# Patient Record
Sex: Male | Born: 1965 | Race: White | Hispanic: No | Marital: Married | Smoking: Former smoker
Health system: Southern US, Community
[De-identification: ages and names within clinical notes are randomized; demographics above are authoritative.]

## PROBLEM LIST (undated history)

## (undated) DIAGNOSIS — F319 Bipolar disorder, unspecified: Secondary | ICD-10-CM

## (undated) DIAGNOSIS — R7303 Prediabetes: Secondary | ICD-10-CM

## (undated) DIAGNOSIS — C4490 Unspecified malignant neoplasm of skin, unspecified: Secondary | ICD-10-CM

## (undated) DIAGNOSIS — K219 Gastro-esophageal reflux disease without esophagitis: Secondary | ICD-10-CM

## (undated) DIAGNOSIS — F419 Anxiety disorder, unspecified: Secondary | ICD-10-CM

## (undated) DIAGNOSIS — M545 Low back pain, unspecified: Secondary | ICD-10-CM

## (undated) DIAGNOSIS — K859 Acute pancreatitis without necrosis or infection, unspecified: Secondary | ICD-10-CM

## (undated) DIAGNOSIS — M1990 Unspecified osteoarthritis, unspecified site: Secondary | ICD-10-CM

## (undated) DIAGNOSIS — G8929 Other chronic pain: Secondary | ICD-10-CM

## (undated) HISTORY — PX: SKIN CANCER EXCISION: SHX779

## (undated) HISTORY — PX: TOTAL HIP ARTHROPLASTY: SHX124

## (undated) HISTORY — PX: COLON SURGERY: SHX602

---

## 2005-07-22 ENCOUNTER — Emergency Department (HOSPITAL_COMMUNITY): Admission: EM | Admit: 2005-07-22 | Discharge: 2005-07-22 | Payer: Self-pay | Admitting: Emergency Medicine

## 2005-08-31 ENCOUNTER — Emergency Department (HOSPITAL_COMMUNITY): Admission: EM | Admit: 2005-08-31 | Discharge: 2005-08-31 | Payer: Self-pay | Admitting: Emergency Medicine

## 2005-12-21 HISTORY — PX: JOINT REPLACEMENT: SHX530

## 2006-01-06 ENCOUNTER — Ambulatory Visit: Payer: Self-pay | Admitting: Unknown Physician Specialty

## 2006-04-04 ENCOUNTER — Emergency Department: Payer: Self-pay | Admitting: Emergency Medicine

## 2006-04-14 ENCOUNTER — Ambulatory Visit: Payer: Self-pay | Admitting: Family Medicine

## 2006-04-19 ENCOUNTER — Ambulatory Visit (HOSPITAL_COMMUNITY): Admission: RE | Admit: 2006-04-19 | Discharge: 2006-04-19 | Payer: Self-pay | Admitting: Orthopedic Surgery

## 2006-05-26 ENCOUNTER — Encounter: Payer: Self-pay | Admitting: Orthopedic Surgery

## 2006-07-15 ENCOUNTER — Encounter: Payer: Self-pay | Admitting: Orthopedic Surgery

## 2006-07-21 ENCOUNTER — Encounter: Payer: Self-pay | Admitting: Orthopedic Surgery

## 2006-12-15 ENCOUNTER — Inpatient Hospital Stay (HOSPITAL_COMMUNITY): Admission: RE | Admit: 2006-12-15 | Discharge: 2006-12-18 | Payer: Self-pay | Admitting: Orthopedic Surgery

## 2007-01-17 ENCOUNTER — Encounter: Payer: Self-pay | Admitting: Orthopedic Surgery

## 2007-01-21 ENCOUNTER — Encounter: Payer: Self-pay | Admitting: Orthopedic Surgery

## 2007-02-19 ENCOUNTER — Encounter: Payer: Self-pay | Admitting: Orthopedic Surgery

## 2007-06-13 ENCOUNTER — Emergency Department: Payer: Self-pay | Admitting: Emergency Medicine

## 2008-07-27 ENCOUNTER — Other Ambulatory Visit: Payer: Self-pay

## 2008-07-27 ENCOUNTER — Emergency Department: Payer: Self-pay | Admitting: Emergency Medicine

## 2014-02-16 ENCOUNTER — Emergency Department: Payer: Self-pay | Admitting: Emergency Medicine

## 2014-02-16 LAB — COMPREHENSIVE METABOLIC PANEL
ALT: 81 U/L — AB (ref 12–78)
Albumin: 4.3 g/dL (ref 3.4–5.0)
Alkaline Phosphatase: 64 U/L
Anion Gap: 11 (ref 7–16)
BUN: 18 mg/dL (ref 7–18)
Bilirubin,Total: 0.5 mg/dL (ref 0.2–1.0)
CHLORIDE: 101 mmol/L (ref 98–107)
CO2: 24 mmol/L (ref 21–32)
Calcium, Total: 10.3 mg/dL — ABNORMAL HIGH (ref 8.5–10.1)
Creatinine: 1.08 mg/dL (ref 0.60–1.30)
EGFR (Non-African Amer.): >60
GLUCOSE: 168 mg/dL — AB (ref 65–99)
Osmolality: 278 (ref 275–301)
Potassium: 4.7 mmol/L (ref 3.5–5.1)
SGOT(AST): 30 U/L (ref 15–37)
SODIUM: 136 mmol/L (ref 136–145)
Total Protein: 8.5 g/dL — ABNORMAL HIGH (ref 6.4–8.2)

## 2014-02-16 LAB — CBC
HCT: 52.5 % — AB (ref 40.0–52.0)
HGB: 17.7 g/dL (ref 13.0–18.0)
MCH: 31.8 pg (ref 26.0–34.0)
MCHC: 33.8 g/dL (ref 32.0–36.0)
MCV: 94 fL (ref 80–100)
Platelet: 199 10*3/uL (ref 150–440)
RBC: 5.57 10*6/uL (ref 4.40–5.90)
RDW: 14.2 % (ref 11.5–14.5)
WBC: 14.6 10*3/uL — ABNORMAL HIGH (ref 3.8–10.6)

## 2014-02-16 LAB — LIPASE, BLOOD: Lipase: 403 U/L — ABNORMAL HIGH (ref 73–393)

## 2015-04-02 DIAGNOSIS — C4491 Basal cell carcinoma of skin, unspecified: Secondary | ICD-10-CM

## 2015-04-02 HISTORY — DX: Basal cell carcinoma of skin, unspecified: C44.91

## 2015-06-28 ENCOUNTER — Other Ambulatory Visit: Payer: Self-pay | Admitting: Internal Medicine

## 2015-06-28 DIAGNOSIS — G8929 Other chronic pain: Secondary | ICD-10-CM

## 2015-06-28 DIAGNOSIS — M25512 Pain in left shoulder: Principal | ICD-10-CM

## 2015-07-04 ENCOUNTER — Ambulatory Visit: Payer: Self-pay

## 2015-07-26 ENCOUNTER — Ambulatory Visit: Admission: RE | Admit: 2015-07-26 | Payer: Self-pay | Source: Ambulatory Visit

## 2015-11-28 ENCOUNTER — Emergency Department (HOSPITAL_COMMUNITY): Payer: Worker's Compensation

## 2015-11-28 ENCOUNTER — Encounter (HOSPITAL_COMMUNITY): Payer: Self-pay

## 2015-11-28 ENCOUNTER — Emergency Department (HOSPITAL_COMMUNITY)
Admission: EM | Admit: 2015-11-28 | Discharge: 2015-11-28 | Disposition: A | Discharge status: Home / Self Care | Payer: Worker's Compensation | Attending: Emergency Medicine | Admitting: Emergency Medicine

## 2015-11-28 DIAGNOSIS — S3992XA Unspecified injury of lower back, initial encounter: Secondary | ICD-10-CM | POA: Insufficient documentation

## 2015-11-28 DIAGNOSIS — Y9389 Activity, other specified: Secondary | ICD-10-CM | POA: Diagnosis not present

## 2015-11-28 DIAGNOSIS — F17200 Nicotine dependence, unspecified, uncomplicated: Secondary | ICD-10-CM | POA: Diagnosis not present

## 2015-11-28 DIAGNOSIS — S199XXA Unspecified injury of neck, initial encounter: Secondary | ICD-10-CM | POA: Diagnosis present

## 2015-11-28 DIAGNOSIS — Y92410 Unspecified street and highway as the place of occurrence of the external cause: Secondary | ICD-10-CM | POA: Diagnosis not present

## 2015-11-28 DIAGNOSIS — S299XXA Unspecified injury of thorax, initial encounter: Secondary | ICD-10-CM | POA: Insufficient documentation

## 2015-11-28 DIAGNOSIS — S161XXA Strain of muscle, fascia and tendon at neck level, initial encounter: Secondary | ICD-10-CM | POA: Diagnosis not present

## 2015-11-28 DIAGNOSIS — Y998 Other external cause status: Secondary | ICD-10-CM | POA: Diagnosis not present

## 2015-11-28 DIAGNOSIS — F319 Bipolar disorder, unspecified: Secondary | ICD-10-CM | POA: Insufficient documentation

## 2015-11-28 HISTORY — DX: Bipolar disorder, unspecified: F31.9

## 2015-11-28 LAB — URINALYSIS, ROUTINE W REFLEX MICROSCOPIC
BILIRUBIN URINE: NEGATIVE
Glucose, UA: 100 mg/dL — AB
HGB URINE DIPSTICK: NEGATIVE
Ketones, ur: NEGATIVE mg/dL
Leukocytes, UA: NEGATIVE
Nitrite: NEGATIVE
PH: 7 (ref 5.0–8.0)
Protein, ur: NEGATIVE mg/dL
SPECIFIC GRAVITY, URINE: 1.02 (ref 1.005–1.030)

## 2015-11-28 MED ORDER — HYDROCODONE-ACETAMINOPHEN 5-325 MG PO TABS
2.0000 | ORAL_TABLET | Freq: Once | ORAL | Status: AC
  Administered 2015-11-28: 2 via ORAL
  Filled 2015-11-28: qty 2

## 2015-11-28 MED ORDER — IBUPROFEN 800 MG PO TABS: 800.0000 mg | ORAL_TABLET | Freq: Three times a day (TID) | ORAL | Status: DC

## 2015-11-28 MED ORDER — HYDROCODONE-ACETAMINOPHEN 5-325 MG PO TABS: 1.0000 | ORAL_TABLET | ORAL | Status: DC | PRN
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
## 2015-11-28 NOTE — ED Notes (Signed)
Pt ambulated in hallway with NT. Pt's gait is steady and even. Pt reports tenderness to neck and lower back. Denies dizziness.
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
## 2015-11-28 NOTE — ED Notes (Signed)
Pt tolerated ambulation well.

## 2015-11-28 NOTE — ED Notes (Signed)
Pt refusing CT scan and lab work. Pt reports that the only reason why his stomach hurt, was due to the need to urinate. MD Rancour notified. Pt ambulated to bathroom to obtain urine specimen.

## 2015-11-28 NOTE — ED Notes (Signed)
Pt's boss arrived requesting drug screen for employees involved in [place]. Informed Lannette Donath that drug screen would be performed in the lab after pt's d/c. Pt given vicodin for pain in ED.
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
## 2015-11-28 NOTE — ED Notes (Signed)
Pt requesting to be d/c. MD Rancour notified.
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
## 2015-11-28 NOTE — ED Notes (Signed)
Pt given water to drink. Pt tolerating well at this time. 

## 2016-04-01 ENCOUNTER — Other Ambulatory Visit: Payer: Self-pay | Admitting: Orthopedic Surgery

## 2016-04-01 DIAGNOSIS — M533 Sacrococcygeal disorders, not elsewhere classified: Secondary | ICD-10-CM

## 2016-04-07 ENCOUNTER — Ambulatory Visit
Admission: RE | Admit: 2016-04-07 | Discharge: 2016-04-07 | Disposition: A | Discharge status: Home / Self Care | Payer: Worker's Compensation | Source: Ambulatory Visit | Attending: Orthopedic Surgery | Admitting: Orthopedic Surgery

## 2016-04-07 DIAGNOSIS — M533 Sacrococcygeal disorders, not elsewhere classified: Secondary | ICD-10-CM

## 2016-05-04 ENCOUNTER — Encounter: Payer: Self-pay | Admitting: *Deleted

## 2016-05-04 ENCOUNTER — Emergency Department: Payer: Self-pay

## 2016-05-04 ENCOUNTER — Emergency Department
Admission: EM | Admit: 2016-05-04 | Discharge: 2016-05-04 | Disposition: A | Discharge status: Home / Self Care | Payer: Self-pay | Attending: Emergency Medicine | Admitting: Emergency Medicine

## 2016-05-04 DIAGNOSIS — F319 Bipolar disorder, unspecified: Secondary | ICD-10-CM | POA: Insufficient documentation

## 2016-05-04 DIAGNOSIS — F17200 Nicotine dependence, unspecified, uncomplicated: Secondary | ICD-10-CM | POA: Insufficient documentation

## 2016-05-04 DIAGNOSIS — K529 Noninfective gastroenteritis and colitis, unspecified: Secondary | ICD-10-CM | POA: Insufficient documentation

## 2016-05-04 LAB — BASIC METABOLIC PANEL
ANION GAP: 8 (ref 5–15)
BUN: 13 mg/dL (ref 6–20)
CO2: 27 mmol/L (ref 22–32)
Calcium: 9.9 mg/dL (ref 8.9–10.3)
Chloride: 104 mmol/L (ref 101–111)
Creatinine, Ser: 1 mg/dL (ref 0.61–1.24)
GFR calc Af Amer: >60 mL/min (ref >60)
GLUCOSE: 180 mg/dL — AB (ref 65–99)
POTASSIUM: 4.8 mmol/L (ref 3.5–5.1)
Sodium: 139 mmol/L (ref 135–145)

## 2016-05-04 LAB — HEPATIC FUNCTION PANEL
ALBUMIN: 4.1 g/dL (ref 3.5–5.0)
ALT: 69 U/L — AB (ref 17–63)
AST: 38 U/L (ref 15–41)
Alkaline Phosphatase: 56 U/L (ref 38–126)
Total Bilirubin: 0.6 mg/dL (ref 0.3–1.2)
Total Protein: 7.3 g/dL (ref 6.5–8.1)

## 2016-05-04 LAB — CBC
HEMATOCRIT: 50.9 % (ref 40.0–52.0)
HEMOGLOBIN: 17.3 g/dL (ref 13.0–18.0)
MCH: 31.4 pg (ref 26.0–34.0)
MCHC: 33.9 g/dL (ref 32.0–36.0)
MCV: 92.6 fL (ref 80.0–100.0)
Platelets: 174 10*3/uL (ref 150–440)
RBC: 5.5 MIL/uL (ref 4.40–5.90)
RDW: 14.2 % (ref 11.5–14.5)
WBC: 8.6 10*3/uL (ref 3.8–10.6)

## 2016-05-04 LAB — LIPASE, BLOOD: LIPASE: 57 U/L — AB (ref 11–51)

## 2016-05-04 LAB — PROTIME-INR
INR: 1.06
Prothrombin Time: 14 seconds (ref 11.4–15.0)

## 2016-05-04 LAB — TROPONIN I: Troponin I: <0.03 ng/mL (ref <0.031)

## 2016-05-04 MED ORDER — OXYCODONE-ACETAMINOPHEN 5-325 MG PO TABS: 1.0000 | ORAL_TABLET | Freq: Four times a day (QID) | ORAL | Status: DC | PRN

## 2016-05-04 MED ORDER — HYDROMORPHONE HCL 1 MG/ML IJ SOLN
INTRAMUSCULAR | Status: AC
  Administered 2016-05-04: 1 mg via INTRAVENOUS
  Filled 2016-05-04: qty 1

## 2016-05-04 MED ORDER — SODIUM CHLORIDE 0.9 % IV BOLUS (SEPSIS)
1000.0000 mL | Freq: Once | INTRAVENOUS | Status: AC
  Administered 2016-05-04: 1000 mL via INTRAVENOUS

## 2016-05-04 MED ORDER — ONDANSETRON HCL 4 MG/2ML IJ SOLN
4.0000 mg | Freq: Once | INTRAMUSCULAR | Status: AC
  Administered 2016-05-04: 4 mg via INTRAVENOUS
  Filled 2016-05-04: qty 2

## 2016-05-04 MED ORDER — HYDROMORPHONE HCL 1 MG/ML IJ SOLN
1.0000 mg | Freq: Once | INTRAMUSCULAR | Status: AC
  Administered 2016-05-04: 1 mg via INTRAVENOUS

## 2016-05-04 MED ORDER — DIATRIZOATE MEGLUMINE & SODIUM 66-10 % PO SOLN
15.0000 mL | Freq: Once | ORAL | Status: AC
  Administered 2016-05-04: 15 mL via ORAL

## 2016-05-04 MED ORDER — OXYCODONE-ACETAMINOPHEN 5-325 MG PO TABS
ORAL_TABLET | ORAL | Status: AC
  Administered 2016-05-04: 1 via ORAL
  Filled 2016-05-04: qty 1

## 2016-05-04 MED ORDER — OXYCODONE-ACETAMINOPHEN 5-325 MG PO TABS
1.0000 | ORAL_TABLET | ORAL | Status: DC | PRN
  Administered 2016-05-04: 1 via ORAL

## 2016-05-04 MED ORDER — ONDANSETRON 4 MG PO TBDP
ORAL_TABLET | ORAL | Status: AC
  Administered 2016-05-04: 4 mg via ORAL
  Filled 2016-05-04: qty 1

## 2016-05-04 MED ORDER — IOPAMIDOL (ISOVUE-300) INJECTION 61%
100.0000 mL | Freq: Once | INTRAVENOUS | Status: AC | PRN
  Administered 2016-05-04: 100 mL via INTRAVENOUS
  Filled 2016-05-04: qty 100

## 2016-05-04 MED ORDER — ONDANSETRON HCL 4 MG PO TABS: 4.0000 mg | ORAL_TABLET | Freq: Three times a day (TID) | ORAL | Status: DC | PRN

## 2016-05-04 MED ORDER — ONDANSETRON 4 MG PO TBDP
4.0000 mg | ORAL_TABLET | Freq: Once | ORAL | Status: AC | PRN
  Administered 2016-05-04: 4 mg via ORAL
  Filled 2016-05-04: qty 6

## 2016-05-04 NOTE — Discharge Instructions (Signed)
If you have any increased pain, vomiting, that is persistent, bleeding from your bottom, lightheadedness, fever, or you feel worse in any way return to the emergency department. We do noticed on her CT scan was likely scar tissue but we would like you to follow-up with your primary care doctor about this, here is the read from CT scan:"1.8 cm right central mesenteric mass. This may represent un irregularly-shaped mesenteric lymph node, an area of scarring or less likely true mesenteric mass. Short-term 3 to 34-month CT follow-up is recommended."  We advised that you have your doctor arrange outpatient CT scan. We also notice that blood sugar is mildly elevated at 180. Please have your doctor check that as well.
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
## 2016-05-04 NOTE — ED Notes (Signed)
PT given ginger ale for PO challenge. 

## 2016-05-04 NOTE — ED Notes (Signed)
Pt up to restroom at this time. Pt reports he feels as though he needs to have a bowel movement.

## 2016-05-04 NOTE — ED Notes (Signed)
Pt reports chest heaviness started at 0300 today with nausea and vomiting, pt reports vomiting 7 times today, pt denies nausea in triage

## 2016-05-04 NOTE — ED Notes (Signed)
Patient transported to ct

## 2016-05-04 NOTE — ED Notes (Signed)
Pt denies nausea after ginger ale and requests another at this time.

## 2016-05-04 NOTE — ED Notes (Signed)
CT called and informed pt has finished contrast at this time.

## 2016-05-04 NOTE — ED Notes (Signed)
Pt vomiting at this time. PT awoke from sleeping in order to vomit. MD made aware. Medication per order.

## 2016-09-02 ENCOUNTER — Encounter (HOSPITAL_COMMUNITY): Payer: Self-pay | Admitting: *Deleted

## 2016-09-02 ENCOUNTER — Observation Stay (HOSPITAL_COMMUNITY): Payer: BLUE CROSS/BLUE SHIELD

## 2016-09-02 ENCOUNTER — Emergency Department (HOSPITAL_COMMUNITY): Payer: BLUE CROSS/BLUE SHIELD

## 2016-09-02 ENCOUNTER — Inpatient Hospital Stay (HOSPITAL_COMMUNITY)
Admission: EM | Admit: 2016-09-02 | Discharge: 2016-09-04 | DRG: 917 | Disposition: A | Discharge status: Home / Self Care | Payer: BLUE CROSS/BLUE SHIELD | Attending: Internal Medicine | Admitting: Internal Medicine

## 2016-09-02 DIAGNOSIS — R7989 Other specified abnormal findings of blood chemistry: Secondary | ICD-10-CM

## 2016-09-02 DIAGNOSIS — Z8249 Family history of ischemic heart disease and other diseases of the circulatory system: Secondary | ICD-10-CM

## 2016-09-02 DIAGNOSIS — F319 Bipolar disorder, unspecified: Secondary | ICD-10-CM

## 2016-09-02 DIAGNOSIS — F419 Anxiety disorder, unspecified: Secondary | ICD-10-CM | POA: Diagnosis present

## 2016-09-02 DIAGNOSIS — R945 Abnormal results of liver function studies: Secondary | ICD-10-CM

## 2016-09-02 DIAGNOSIS — F17210 Nicotine dependence, cigarettes, uncomplicated: Secondary | ICD-10-CM

## 2016-09-02 DIAGNOSIS — IMO0002 Reserved for concepts with insufficient information to code with codable children: Secondary | ICD-10-CM

## 2016-09-02 DIAGNOSIS — T43591A Poisoning by other antipsychotics and neuroleptics, accidental (unintentional), initial encounter: Principal | ICD-10-CM | POA: Diagnosis present

## 2016-09-02 DIAGNOSIS — R079 Chest pain, unspecified: Secondary | ICD-10-CM | POA: Diagnosis not present

## 2016-09-02 DIAGNOSIS — K859 Acute pancreatitis without necrosis or infection, unspecified: Secondary | ICD-10-CM | POA: Diagnosis not present

## 2016-09-02 DIAGNOSIS — G4700 Insomnia, unspecified: Secondary | ICD-10-CM | POA: Diagnosis present

## 2016-09-02 DIAGNOSIS — T50901A Poisoning by unspecified drugs, medicaments and biological substances, accidental (unintentional), initial encounter: Secondary | ICD-10-CM

## 2016-09-02 DIAGNOSIS — R229 Localized swelling, mass and lump, unspecified: Secondary | ICD-10-CM

## 2016-09-02 DIAGNOSIS — Z833 Family history of diabetes mellitus: Secondary | ICD-10-CM

## 2016-09-02 HISTORY — DX: Unspecified osteoarthritis, unspecified site: M19.90

## 2016-09-02 HISTORY — DX: Other chronic pain: G89.29

## 2016-09-02 HISTORY — DX: Prediabetes: R73.03

## 2016-09-02 HISTORY — DX: Unspecified malignant neoplasm of skin, unspecified: C44.90

## 2016-09-02 HISTORY — DX: Low back pain: M54.5

## 2016-09-02 HISTORY — DX: Low back pain, unspecified: M54.50

## 2016-09-02 HISTORY — DX: Acute pancreatitis without necrosis or infection, unspecified: K85.90

## 2016-09-02 LAB — LIPID PANEL
Cholesterol: 180 mg/dL (ref 0–200)
HDL: 36 mg/dL — ABNORMAL LOW (ref >40)
LDL Cholesterol: 121 mg/dL — ABNORMAL HIGH (ref 0–99)
Total CHOL/HDL Ratio: 5 RATIO
Triglycerides: 115 mg/dL (ref <150)
VLDL: 23 mg/dL (ref 0–40)

## 2016-09-02 LAB — CBC
HEMATOCRIT: 48 % (ref 39.0–52.0)
Hemoglobin: 17 g/dL (ref 13.0–17.0)
MCH: 33.1 pg (ref 26.0–34.0)
MCHC: 35.4 g/dL (ref 30.0–36.0)
MCV: 93.4 fL (ref 78.0–100.0)
Platelets: 177 10*3/uL (ref 150–400)
RBC: 5.14 MIL/uL (ref 4.22–5.81)
RDW: 14.1 % (ref 11.5–15.5)
WBC: 8.2 10*3/uL (ref 4.0–10.5)

## 2016-09-02 LAB — COMPREHENSIVE METABOLIC PANEL
ALBUMIN: 4.2 g/dL (ref 3.5–5.0)
ALT: 193 U/L — AB (ref 17–63)
AST: 110 U/L — AB (ref 15–41)
Alkaline Phosphatase: 88 U/L (ref 38–126)
Anion gap: 15 (ref 5–15)
BILIRUBIN TOTAL: 1.6 mg/dL — AB (ref 0.3–1.2)
BUN: 19 mg/dL (ref 6–20)
CHLORIDE: 101 mmol/L (ref 101–111)
CO2: 22 mmol/L (ref 22–32)
CREATININE: 1.17 mg/dL (ref 0.61–1.24)
Calcium: 9.9 mg/dL (ref 8.9–10.3)
GFR calc Af Amer: >60 mL/min (ref >60)
GLUCOSE: 174 mg/dL — AB (ref 65–99)
POTASSIUM: 4.8 mmol/L (ref 3.5–5.1)
Sodium: 138 mmol/L (ref 135–145)
TOTAL PROTEIN: 7.1 g/dL (ref 6.5–8.1)

## 2016-09-02 LAB — I-STAT TROPONIN, ED: Troponin i, poc: 0 ng/mL (ref 0.00–0.08)

## 2016-09-02 LAB — LIPASE, BLOOD: LIPASE: 504 U/L — AB (ref 11–51)

## 2016-09-02 LAB — VALPROIC ACID LEVEL: VALPROIC ACID LVL: 52 ug/mL (ref 50.0–100.0)

## 2016-09-02 LAB — ETHANOL: Alcohol, Ethyl (B): <5 mg/dL (ref <5)

## 2016-09-02 LAB — CK: Total CK: 114 U/L (ref 49–397)

## 2016-09-02 MED ORDER — IOPAMIDOL (ISOVUE-300) INJECTION 61%
INTRAVENOUS | Status: AC
  Administered 2016-09-02: 100 mL
  Filled 2016-09-02: qty 100

## 2016-09-02 MED ORDER — SODIUM CHLORIDE 0.9 % IV BOLUS (SEPSIS)
1000.0000 mL | Freq: Once | INTRAVENOUS | Status: AC
  Administered 2016-09-02: 1000 mL via INTRAVENOUS

## 2016-09-02 MED ORDER — MORPHINE SULFATE (PF) 2 MG/ML IV SOLN
1.0000 mg | INTRAVENOUS | Status: AC | PRN
  Administered 2016-09-03 (×2): 1 mg via INTRAVENOUS
  Filled 2016-09-02 (×2): qty 1

## 2016-09-02 MED ORDER — ONDANSETRON HCL 4 MG/2ML IJ SOLN
4.0000 mg | Freq: Once | INTRAMUSCULAR | Status: AC
Start: 2016-09-02 — End: 2016-09-02
  Administered 2016-09-02: 4 mg via INTRAVENOUS
  Filled 2016-09-02: qty 2

## 2016-09-02 MED ORDER — ENOXAPARIN SODIUM 40 MG/0.4ML ~~LOC~~ SOLN
40.0000 mg | SUBCUTANEOUS | Status: DC
  Administered 2016-09-02 – 2016-09-03 (×2): 40 mg via SUBCUTANEOUS
  Filled 2016-09-02 (×2): qty 0.4

## 2016-09-02 MED ORDER — ONDANSETRON HCL 4 MG/2ML IJ SOLN: 4.0000 mg | Freq: Three times a day (TID) | INTRAMUSCULAR | Status: DC | PRN

## 2016-09-02 MED ORDER — SODIUM CHLORIDE 0.9 % IV SOLN
INTRAVENOUS | Status: DC
  Administered 2016-09-02: 250 mL via INTRAVENOUS
  Administered 2016-09-02 (×2): via INTRAVENOUS
  Administered 2016-09-03: 1000 mL via INTRAVENOUS
  Administered 2016-09-03: 23:00:00 via INTRAVENOUS
  Administered 2016-09-03 (×2): 1000 mL via INTRAVENOUS
  Administered 2016-09-04: 05:00:00 via INTRAVENOUS

## 2016-09-02 MED ORDER — QUETIAPINE FUMARATE 200 MG PO TABS: 200.0000 mg | ORAL_TABLET | Freq: Every day | ORAL | Status: DC

## 2016-09-02 MED ORDER — SODIUM CHLORIDE 0.9 % IV SOLN: INTRAVENOUS | Status: DC

## 2016-09-02 NOTE — H&P (Signed)
Date: 09/02/2016               Patient Name:  Scott Daniel MRN: [id number]  DOB: 02-Mar-1966 Age / Sex: 50 y.o., male   PCP: Katheren Shams         Medical Service: Internal Medicine Teaching Service         Attending Physician: Dr. Lucious Groves, DO    First Contact: Dr. Einar Gip, DO Pager: [phone]  Second Contact: Dr. Julious Oka, MD Pager: [phone]       After Hours (After 5p/  First Contact Pager: [phone]  weekends / holidays): Second Contact Pager: [phone]   Chief Complaint: nausea, vomiting, abdominal pain.  History of Present Illness:  [name] is a 50 y/o M with MHx significant for Bipolar 1 disorder, recurrent pancreatitis and tobacco abuse who presents to the hospital for evaluation of abdominal pain, nausea and vomiting. He reports that these symptoms began yesterday suddenly and complains of multiple episodes of non-bloody and non-bilious vomiting. He believes this may be due to eating some "bad ham" last night however denies any diarrhea. He report he has not been able to keep food or drink down since that time however was requesting food in the ED.  He complains of abdominal pain currently rated 4/10 but was 10/10 earlier. He localizes the pain in the epigastrium and mid-abdomen and states there was radiation to the back initially but that has subsided at this time. He reports he gets pancreatitis about "once a year" and his prior episodes feel similar.  Of note, per patient he took an entire months supply of Seroquil over 1 week while he was on vacation last week because he "got confused about the dosing instructions." He is supposed to take 400 mg daily however emptied an entire new prescription bottle over the course of 1 week. When describing his dosing regimen the patient was actually taking Seroquel from 2 different prescription bottles at once and now both are empty. During that time he reports confusion, headaches, feeling like he was "bouncing off the  walls" however had difficulty describing this. Also, the patient reports that starting Sunday night/Monday morning he felt extremely hot and had to use ice packs on his neck and head to cool off. He is concerned about withdrawal as he hasn't taken any since Saturday.   In the ED, EKG revealed bradycardia with rate of 47 without prolonged QT. The remainder of vital signs were stable.  CMP revealed an elevated glucose at 174, elevated Lipase of 504, a transaminitis with AST 110 and ALT of 193 and elevated bilirubin at 1.6. CBC unremarkable. He subsequently received a GI cocktail, IV Zofran and a 1 L bolus of ns and was admitted for management of pancreatitis.    Meds:  Current Meds  Medication Sig  . ALPRAZolam (XANAX) 0.25 MG tablet Take 0.25 mg by mouth once.  Marland Kitchen ibuprofen (ADVIL,MOTRIN) 800 MG tablet Take 1 tablet (800 mg total) by mouth 3 (three) times daily.  Marland Kitchen lamoTRIgine (LAMICTAL) 150 MG tablet TAKE ONE TABLET BY MOUTH EACH NIGHT AT BEDTIME  . LevOCARNitine (L-CARNITINE) 250 MG CAPS Take 250 mg by mouth daily.  . ondansetron (ZOFRAN) 4 MG tablet Take 1 tablet (4 mg total) by mouth every 8 (eight) hours as needed for nausea or vomiting.  Marland Kitchen QUEtiapine (SEROQUEL) 200 MG tablet Take 400 mg by mouth at bedtime.     Allergies: Allergies as of 09/02/2016  . (No Known  Allergies)   Past Medical History:  Diagnosis Date  . Bipolar 1 disorder ([place])     Family History:  Father: Aneurism Mother: DM  Social History:  Tobacco: 7-8 cigarettes per day x 20 years Alcohol: "No" but admitted to drinking "4 shots" on Saturday Drug use: Smokes marijuana occasionally  Review of Systems: A complete ROS was negative except as per HPI.  Physical Exam: Blood pressure 135/86, pulse (!) 59, temperature 97.7 F (36.5 C), temperature source Oral, resp. rate 21, height 5\' 11"  (1.803 m), weight 190 lb (86.2 kg), SpO2 97 %. General: Alert, comfortable appearing middle aged male resting in bed. In no  acute distress. Responds to all questions appropriately. Cardiovascular: Bradycardic but regular, rate approximately 50 during examination. Without murmur, gallop or rub Pulmonary: CTA BL, without wheezing, rhonchi or crackles. Breathing unlabored.  Abdomen: Soft, non-distended. Tenderness to palpation most prominent in the epigastrium. Without rebound tenderness or rigidity. Scar present from umbilicus down - from abdominal surgery when young. Well healed.  Extremities: Without edema BL. No suspicious lesions. No muscle tenderness.  Neuro: DTR's 2/4 BL. No focal weakness. PERLL.   EKG:   EKG Interpretation  Date/Time:  Wednesday September 02 2016 09:53:02 EDT Ventricular Rate:  47 PR Interval:    QRS Duration: 107 QT Interval:  476 QTC Calculation: 421 R Axis:   88 Text Interpretation:  Sinus bradycardia T wave changes in aVL new  Confirmed by LIU MD, DANA [id number]) on 09/02/2016 10:15:23 AM      CXR: Negative for any acute cardiopulmonary process  CT abdomen/pelvis with contrast:  -Peri-pancreatic stranding suggestive for pancreatic inflammation and acute pancreatitis. No evidence for focal fluid collection of pseudocyst formation.  -Fatty liver -Multiple small lymph nodes in mesentery, no significant change from prior exam  Assessment & Plan by Problem: Principal Problem:   Pancreatitis Active Problems:   Medication overdose   Bipolar 1 disorder (HCC)   Elevated LFTs   1. Acute Pancreatitis Pt reports history of pancreatitis. Pt with nausea, vomiting and abdominal pain with radiation to the back x2 days. Lipase elevated at 503 consistent with diagnosis of acute pancreatitis. AST and ALT elevated at 110 and 193 respectively. Bilirubin elevated at 1.6.  No leukocytosis. CT abdomen/pelvis ordered for further evaluation. Review of CT abd/pelv 5/17 showed a questionable 2 cm mesenteric mass with recommendation for repeat ct in 3-6 months.  -CT abdomen/pelvis with evidence of  peri-pancreatic stranding suggestive of pancreatic inflammation and acute pancreatitis. CT showed that the questionable mesenteric mass are multiple mesenteric lymph nodes.  -Zofran PRN nausea, vomiting -Patient received 1L ns bolus in ED. Ordering an additional 1L bolus of ns + IV NS at 150 ml/hr -NPO except ice chips, per pt request -Repeat CMET for tomorrow AM  2. Medication Overdose Patient reports taking an entire months worth of Seroquel over the course of 1 week as he was confused on the dosing instructions. Patient reports he emptied 2 bottles of this medication last week so he could have ingested more than reported. He was prescribed 400 mg Seroquel daily at bedtime. Last week the patient felt like he was "bouncing off the walls" but could not tell me what he meant by this. He also reports confusion and headaches. Per patient he denied any tremors, muscle rigidity or involuntary movements. He reports that starting late Saturday night/early Sunday he began feeling extremely overheated and had to use ice packs on his neck and head in order to cool off.   -Doubt  NMS as patient without tachycardia, muscle rigidity, lethargy, or delirium -EKG showing bradycardia without QT prolongation -Holding Seroquel as this medication is known to cause pancreatitis.  -Monitoring for tachycardia, muscle rigidity, confusion -On tele -Checking CK level -Repeat EKG in AM  3. Bipolar 1 Disorder Patient reports he is seen at [facility] "often" for management of his Bipolar disorder. Reports they have been having difficulty lately finding adequate treatment of his condition.  -Slowly restarting patient on Seroquel 200 mg QHS as he has been out of this medication for several days.   IVF: NS @ 150 ml/hr DVT Prophylaxis: Lovenox Diet: NPO except ice chips Code status: Full code  Dispo: Admit patient to Observation with expected length of stay less than 2 midnights.  SignedEinar Gip, DO 09/02/2016, 1:13  PM  Pager: [phone]

## 2016-09-02 NOTE — ED Notes (Signed)
Admitting at bedside 

## 2016-09-02 NOTE — ED Triage Notes (Signed)
Pt in from home c/o mid non radiating CP onset yesterday, pt c/o n/v & SOB, pt denies diarrhea, pt rcvd ASA pta but vomited afterwards, pt reports pain radiates to mid back, pt A&O x4, pt SB in route

## 2016-09-02 NOTE — ED Provider Notes (Signed)
[facility] DEPT Provider Note   CSN: [id number] Arrival date & time: 09/02/16  [id number]  History   Chief Complaint Chief Complaint  Patient presents with  . Chest Pain    HPI Scott Daniel is a 50 y.o. male.  HPI  50 y.o. male with a hx of Bipolar I Disorder, presents to the Emergency Department today complaining of Chest pain with onset yesterday afternoon. Pt states that he was eating food when he threw up and started to feel pain. Notes it was initially 8/10 pain. No radiation. No diaphoresis. No SOB. Notes most of his pain was in epigastrium and radiated upwards. Denies pain currently. Pt notes that he ran out of Seroquel x 2 weeks ago and just recently got his Rxed filled. Has not taken medication today due to emesis. Emesis is intermittent with PO intake. Notes anxiety with not taking medication for his Bipolar disorder. No hx ACS. No FH. Risk Factors: HTN, Smoking. No Hx DVT/PE. No recent long distance travel. No recent surgeries. Pt with normal BMs. Pt passing flatus. Does have hx Pancreatitis. Does not drink excessive ETOH. No other symptoms noted.   Past Medical History:  Diagnosis Date  . Bipolar 1 disorder ([place])     There are no active problems to display for this patient.   Past Surgical History:  Procedure Laterality Date  . HIP FRACTURE SURGERY         Home Medications    Prior to Admission medications   Medication Sig Start Date End Date Taking? Authorizing Provider  DEPAKOTE ER 500 MG 24 hr tablet Take 1,500 mg by mouth at bedtime. 10/02/15   Historical Provider, MD  HYDROcodone-acetaminophen (NORCO/VICODIN) 5-325 MG tablet Take 1 tablet by mouth every 4 (four) hours as needed. 11/28/15   Ezequiel Essex, MD  ibuprofen (ADVIL,MOTRIN) 800 MG tablet Take 1 tablet (800 mg total) by mouth 3 (three) times daily. 11/28/15   Ezequiel Essex, MD  lamoTRIgine (LAMICTAL) 150 MG tablet TAKE ONE TABLET BY MOUTH EACH NIGHT AT BEDTIME 11/05/15   Historical Provider, MD    ondansetron (ZOFRAN) 4 MG tablet Take 1 tablet (4 mg total) by mouth every 8 (eight) hours as needed for nausea or vomiting. 05/04/16   Schuyler Amor, MD  oxyCODONE-acetaminophen (ROXICET) 5-325 MG tablet Take 1 tablet by mouth every 6 (six) hours as needed. 05/04/16   Schuyler Amor, MD  traZODone (DESYREL) 50 MG tablet TAKE ONE TO TWO TABLETS AT BED TIME AS NEEDED 10/18/15   Historical Provider, MD    Family History No family history on file.  Social History Social History  Substance Use Topics  . Smoking status: Current Every Day Smoker    Packs/day: 0.25    Types: Cigarettes  . Smokeless tobacco: Never Used  . Alcohol use Yes     Allergies   Review of patient's allergies indicates no known allergies.   Review of Systems Review of Systems ROS reviewed and all are negative for acute change except as noted in the HPI.  Physical Exam Updated Vital Signs BP 143/95 (BP Location: Right Arm)   Pulse (!) 45   Temp 97.7 F (36.5 C) (Oral)   Resp 16   Ht 5\' 11"  (1.803 m)   Wt 86.2 kg   SpO2 99%   BMI 26.50 kg/m   Physical Exam  Constitutional: He is oriented to person, place, and time. Vital signs are normal. He appears well-developed and well-nourished.  HENT:  Head: Normocephalic.  Right Ear:  Hearing normal.  Left Ear: Hearing normal.  Eyes: Conjunctivae and EOM are normal. Pupils are equal, round, and reactive to light.  Neck: Normal range of motion. Neck supple.  Cardiovascular: Normal rate, regular rhythm, normal heart sounds and intact distal pulses.  Exam reveals no gallop and no friction rub.   No murmur heard. Pulmonary/Chest: Effort normal and breath sounds normal. No respiratory distress. He has no wheezes. He has no rales. He exhibits no tenderness.  Abdominal: Soft. Normal appearance and bowel sounds are normal. There is tenderness in the epigastric area and left upper quadrant. There is no rigidity, no rebound, no guarding, no CVA tenderness, no tenderness  at McBurney's point and negative Murphy's sign.  Neurological: He is alert and oriented to person, place, and time.  Skin: Skin is warm and dry.  Psychiatric: He has a normal mood and affect. His speech is normal and behavior is normal. Thought content normal.  Nursing note and vitals reviewed.  ED Treatments / Results  Labs (all labs ordered are listed, but only abnormal results are displayed) Labs Reviewed  COMPREHENSIVE METABOLIC PANEL - Abnormal; Notable for the following:       Result Value   Glucose, Bld 174 (*)    AST 110 (*)    ALT 193 (*)    Total Bilirubin 1.6 (*)    All other components within normal limits  LIPASE, BLOOD - Abnormal; Notable for the following:    Lipase 504 (*)    All other components within normal limits  CBC  I-STAT TROPOININ, ED   EKG  EKG Interpretation  Date/Time:  Wednesday September 02 2016 09:53:02 EDT Ventricular Rate:  47 PR Interval:    QRS Duration: 107 QT Interval:  476 QTC Calculation: 421 R Axis:   88 Text Interpretation:  Sinus bradycardia T wave changes in aVL new  Confirmed by LIU MD, DANA [phone]) on 09/02/2016 10:15:23 AM      Radiology Dg Chest 2 View  Result Date: 09/02/2016 CLINICAL DATA:  Chest pain for 2 days. EXAM: CHEST  2 VIEW COMPARISON:  Two-view chest x-ray a 05/04/2016 FINDINGS: The heart size and mediastinal contours are within normal limits. Both lungs are clear. The visualized skeletal structures are unremarkable. IMPRESSION: Negative two view chest x-ray Electronically Signed   By: San Morelle M.D.   On: 09/02/2016 11:10    Procedures Procedures (including critical care time)  Medications Ordered in ED Medications  ondansetron (ZOFRAN) injection 4 mg (4 mg Intravenous Given 09/02/16 1034)  sodium chloride 0.9 % bolus 1,000 mL (1,000 mLs Intravenous New Bag/Given 09/02/16 1035)    Initial Impression / Assessment and Plan / ED Course  I have reviewed the triage vital signs and the nursing  notes.  Pertinent labs & imaging results that were available during my care of the patient were reviewed by me and considered in my medical decision making (see chart for details).  Clinical Course    Final Clinical Impressions(s) / ED Diagnoses  I have reviewed and evaluated the relevant laboratory valuesI have reviewed and evaluated the relevant imaging studies. I have interpreted the relevant EKG.I have reviewed the relevant previous healthcare records.I have reviewed EMS Documentation.I obtained HPI from historian. Patient discussed with supervising physician  ED Course:  Assessment: Pt is a 50yM presents with CP with onset yesterday afternoon s/p emesis. Risk Factors HTN, Smoking. Given GI cocktail in ED. Patient is to be discharged with recommendation to follow up with PCP in regards to today's  hospital visit. Chest pain is not likely of cardiac or pulmonary etiology d/t presentation, perc negative, VSS, no tracheal deviation, no JVD or new murmur, RRR, breath sounds equal bilaterally, EKG without acute abnormalities, negative troponin, and negative CXR. Lipase elevated 500s. Heart Score 2. Pt unable to tolerate PO in ED. Will admit to medicine for Pancreatitis.    Disposition/Plan:  Admit Pt acknowledges and agrees with plan  Supervising Physician Forde Dandy, MD   Final diagnoses:  Acute pancreatitis, unspecified pancreatitis type    New Prescriptions New Prescriptions   No medications on file     Shary Decamp, PA-C 09/02/16 Ravenna Liu, MD 09/02/16 1739

## 2016-09-02 NOTE — ED Notes (Signed)
[name] PA at bedside   

## 2016-09-02 NOTE — ED Notes (Signed)
Patient transported to X-ray 

## 2016-09-02 NOTE — ED Notes (Signed)
Attempted to call report

## 2016-09-03 DIAGNOSIS — G4700 Insomnia, unspecified: Secondary | ICD-10-CM | POA: Diagnosis present

## 2016-09-03 DIAGNOSIS — F17210 Nicotine dependence, cigarettes, uncomplicated: Secondary | ICD-10-CM | POA: Diagnosis present

## 2016-09-03 DIAGNOSIS — T43591A Poisoning by other antipsychotics and neuroleptics, accidental (unintentional), initial encounter: Secondary | ICD-10-CM | POA: Diagnosis present

## 2016-09-03 DIAGNOSIS — F319 Bipolar disorder, unspecified: Secondary | ICD-10-CM | POA: Diagnosis present

## 2016-09-03 DIAGNOSIS — Z833 Family history of diabetes mellitus: Secondary | ICD-10-CM | POA: Diagnosis not present

## 2016-09-03 DIAGNOSIS — R079 Chest pain, unspecified: Secondary | ICD-10-CM | POA: Diagnosis present

## 2016-09-03 DIAGNOSIS — F419 Anxiety disorder, unspecified: Secondary | ICD-10-CM | POA: Diagnosis present

## 2016-09-03 DIAGNOSIS — R945 Abnormal results of liver function studies: Secondary | ICD-10-CM | POA: Diagnosis not present

## 2016-09-03 DIAGNOSIS — K859 Acute pancreatitis without necrosis or infection, unspecified: Secondary | ICD-10-CM | POA: Diagnosis present

## 2016-09-03 LAB — HEPATITIS C ANTIBODY: HCV Ab: 0.1 s/co ratio (ref 0.0–0.9)

## 2016-09-03 LAB — COMPREHENSIVE METABOLIC PANEL
ALBUMIN: 3 g/dL — AB (ref 3.5–5.0)
ALT: 91 U/L — ABNORMAL HIGH (ref 17–63)
AST: 37 U/L (ref 15–41)
Alkaline Phosphatase: 53 U/L (ref 38–126)
Anion gap: 6 (ref 5–15)
BUN: 12 mg/dL (ref 6–20)
CHLORIDE: 109 mmol/L (ref 101–111)
CO2: 24 mmol/L (ref 22–32)
Calcium: 7.7 mg/dL — ABNORMAL LOW (ref 8.9–10.3)
Creatinine, Ser: 0.94 mg/dL (ref 0.61–1.24)
GFR calc Af Amer: >60 mL/min (ref >60)
GFR calc non Af Amer: >60 mL/min (ref >60)
GLUCOSE: 101 mg/dL — AB (ref 65–99)
POTASSIUM: 3.7 mmol/L (ref 3.5–5.1)
SODIUM: 139 mmol/L (ref 135–145)
Total Bilirubin: 0.9 mg/dL (ref 0.3–1.2)
Total Protein: 4.9 g/dL — ABNORMAL LOW (ref 6.5–8.1)

## 2016-09-03 LAB — HEPATITIS A ANTIBODY, TOTAL: Hep A Total Ab: NEGATIVE

## 2016-09-03 LAB — HEPATITIS B CORE ANTIBODY, TOTAL: HEP B C TOTAL AB: NEGATIVE

## 2016-09-03 LAB — HIV ANTIBODY (ROUTINE TESTING W REFLEX): HIV Screen 4th Generation wRfx: NONREACTIVE

## 2016-09-03 LAB — HEPATITIS B SURFACE ANTIBODY, QUANTITATIVE

## 2016-09-03 LAB — GLUCOSE, CAPILLARY: Glucose-Capillary: 106 mg/dL — ABNORMAL HIGH (ref 65–99)

## 2016-09-03 MED ORDER — LAMOTRIGINE 25 MG PO TABS
150.0000 mg | ORAL_TABLET | Freq: Every day | ORAL | Status: DC
  Filled 2016-09-03: qty 1

## 2016-09-03 NOTE — Progress Notes (Signed)
   Subjective:  [name] was seen and evaluated today at beside around 10:30 this morning. He reports he is feeling hungry and wants to eat however feels it would make him sick. He reports he had an exacerbation of his abdominal pain brought on by consuming clear liquids which was ordered per patients request. He denies any nausea or vomiting overnight.    Objective:  Vital signs in last 24 hours: Vitals:   09/02/16 1545 09/02/16 1624 09/02/16 2241 09/03/16 0539  BP: 106/87 127/79 114/81 118/81  Pulse: 84 69 77 65  Resp: 16  18 18   Temp:  98.2 F (36.8 C) 98.4 F (36.9 C) 98.1 F (36.7 C)  TempSrc:  Oral Oral Oral  SpO2: 98% 97% 97% 97%  Weight:      Height:       Scheduled Medications: . enoxaparin (LOVENOX) injection  40 mg Subcutaneous Q24H  . lamoTRIgine  150 mg Oral QHS   Physical Exam  Constitutional: He is well-developed, well-nourished, and in no distress.  HENT:  Head: Normocephalic and atraumatic.  Cardiovascular: Normal rate and regular rhythm.   No murmur heard. Pulmonary/Chest: Effort normal and breath sounds normal. No respiratory distress. He has no wheezes. He has no rales.  Abdominal: Soft. Bowel sounds are normal. He exhibits no distension. There is tenderness. There is no rebound.  Tenderness to palpation of epigastric region.   Musculoskeletal: He exhibits no edema.  Neurological: He is alert.  Skin: Skin is warm and dry. No rash noted. He is not diaphoretic. No erythema.    Assessment/Plan:  Principal Problem:   Pancreatitis Active Problems:   Medication overdose   Bipolar 1 disorder (HCC)   Elevated LFTs  Pancreatitis This is likely secondary to patient consuming a months worth of Seroquel over the course of 1 week. Lipid panel reveals no hyperglyceridemia, ethanol level 0 and patient reports he does not drink excessive alcohol. CT abdomen without evidence for gallstones. Good discussion was had with the patient regarding the need for him to be  NPO and advance his diet very slowly. As patient was without abdominal pain last night and seemed to be improving, a clear liquid diet was ordered per patients request. Per patient this morning, he developed abdominal pain after consuming the clear liquids requiring Morphine so his diet will be de-escalated to NPO except ice chips until his pancreas has time to heal. He denies any more nausea or vomiting. -HIV screen negative -Hepatitis screen for A, B, C negative as well except shows his levels for HBV surface antibody low.  --Patient will be vaccinated HepB and HepA tomorrow -NPO except ice chips -Fluids running at 250 ml/hr, will d/c when patient has good PO intake -On tele -Holding Depakote and Seroquel as they may have contributed to his current condition  Medication Overdose Patient reports taking an entire months supply of Seroquel over 1 week duration. Doubt NMS at this time as he is without tachycardia, muscle ridigity, lethargy, delirium and CK level normal.  -On tele -Repeat EKG normal -Holding Seroquel and Depakote  Bipolar 1 Disorder Coordinated care and discussed case with patients psychiatrist Dr. Clovis Pu who recommended discontinuing Seroquel and Depakote and starting Trazodone at night. Dr. Clovis Pu also mentioned considering starting Lithium or Clonidine for bipolar disorder.  -Will start Trazodone QHS if called for insomnia -Consider starting Lithium or Clonidine   Dispo: Anticipated discharge in approximately 1-2 day(s).   Scott Berko, DO 09/03/2016, 10:29 AM Pager: [phone]

## 2016-09-03 NOTE — Care Management Note (Signed)
Case Management Note  Patient Details  Name: Scott Daniel MRN: [id number] Date of Birth: 1966-02-26  Subjective/Objective:                 Independent patient from home in obs for pancreatitis. Patient states he was recently [place] vacation at the beach and left his wallet and phone behind. His wife is bringing them today, including his insurance cards for [employer]. PCP Dr Ginette Pitman.    Action/Plan:  No CM needs identified.  Expected Discharge Date:                  Expected Discharge Plan:  Home/Self Care  In-House Referral:  NA  Discharge planning Services  CM Consult  Post Acute Care Choice:  NA Choice offered to:  NA  DME Arranged:  N/A DME Agency:  NA  HH Arranged:  NA HH Agency:  NA  Status of Service:  Completed, signed off  If discussed at [place] of Stay Meetings, dates discussed:    Additional Comments:  Carles Collet, RN 09/03/2016, 11:41 AM

## 2016-09-04 LAB — COMPREHENSIVE METABOLIC PANEL
ALK PHOS: 60 U/L (ref 38–126)
ALT: 79 U/L — AB (ref 17–63)
AST: 30 U/L (ref 15–41)
Albumin: 3.5 g/dL (ref 3.5–5.0)
Anion gap: 8 (ref 5–15)
BUN: 6 mg/dL (ref 6–20)
CALCIUM: 8.6 mg/dL — AB (ref 8.9–10.3)
CHLORIDE: 108 mmol/L (ref 101–111)
CO2: 22 mmol/L (ref 22–32)
CREATININE: 0.81 mg/dL (ref 0.61–1.24)
GFR calc Af Amer: >60 mL/min (ref >60)
GFR calc non Af Amer: >60 mL/min (ref >60)
GLUCOSE: 91 mg/dL (ref 65–99)
Potassium: 3.2 mmol/L — ABNORMAL LOW (ref 3.5–5.1)
SODIUM: 138 mmol/L (ref 135–145)
Total Bilirubin: 0.6 mg/dL (ref 0.3–1.2)
Total Protein: 6 g/dL — ABNORMAL LOW (ref 6.5–8.1)

## 2016-09-04 LAB — CBC
HCT: 42 % (ref 39.0–52.0)
HEMOGLOBIN: 14.2 g/dL (ref 13.0–17.0)
MCH: 31.7 pg (ref 26.0–34.0)
MCHC: 33.8 g/dL (ref 30.0–36.0)
MCV: 93.8 fL (ref 78.0–100.0)
PLATELETS: 122 10*3/uL — AB (ref 150–400)
RBC: 4.48 MIL/uL (ref 4.22–5.81)
RDW: 13.7 % (ref 11.5–15.5)
WBC: 5.1 10*3/uL (ref 4.0–10.5)

## 2016-09-04 MED ORDER — POTASSIUM CHLORIDE CRYS ER 20 MEQ PO TBCR
30.0000 meq | EXTENDED_RELEASE_TABLET | Freq: Once | ORAL | Status: AC
  Administered 2016-09-04: 30 meq via ORAL
  Filled 2016-09-04: qty 1

## 2016-09-04 MED ORDER — MORPHINE SULFATE (PF) 2 MG/ML IV SOLN
1.0000 mg | INTRAVENOUS | Status: DC | PRN
  Administered 2016-09-04: 1 mg via INTRAVENOUS
  Filled 2016-09-04: qty 1

## 2016-09-04 NOTE — Discharge Summary (Signed)
Name: Scott Daniel MRN: [id number] DOB: 1966-10-14 50 y.o. PCP: Tracie Harrier, MD  Date of Admission: 09/02/2016  9:42 AM Date of Discharge: 09/04/2016 Attending Physician: Joni Reining, DO  Discharge Diagnosis: 1. Pancreatitis 2. Medication Overdose 3. Bipolar 1 Disorder Principal Problem:   Pancreatitis Active Problems:   Medication overdose   Bipolar 1 disorder (HCC)   Elevated LFTs   Discharge Medications:   Medication List    STOP taking these medications   oxyCODONE-acetaminophen 5-325 MG tablet Commonly known as:  ROXICET   QUEtiapine 200 MG tablet Commonly known as:  SEROQUEL     TAKE these medications   ALPRAZolam 0.25 MG tablet Commonly known as:  XANAX Take 0.25 mg by mouth once.   HYDROcodone-acetaminophen 5-325 MG tablet Commonly known as:  NORCO/VICODIN Take 1 tablet by mouth every 4 (four) hours as needed.   ibuprofen 800 MG tablet Commonly known as:  ADVIL,MOTRIN Take 1 tablet (800 mg total) by mouth 3 (three) times daily.   L-Carnitine 250 MG Caps Take 250 mg by mouth daily.   lamoTRIgine 150 MG tablet Commonly known as:  LAMICTAL TAKE ONE TABLET BY MOUTH EACH NIGHT AT BEDTIME   ondansetron 4 MG tablet Commonly known as:  ZOFRAN Take 1 tablet (4 mg total) by mouth every 8 (eight) hours as needed for nausea or vomiting.   traZODone 50 MG tablet Commonly known as:  DESYREL TAKE ONE TO TWO TABLETS AT BED TIME AS NEEDED       Disposition and follow-up:   Scott Daniel was discharged from [facility] in [place]  condition.  At the hospital follow up visit please address:  1.  Is he taking all medications as directed? How are his bipolar symptoms? Has he had any more abdominal pain?  2.  Labs / imaging needed at time of follow-up: none.  3.  Pending labs/ test needing follow-up: none.  Follow-up Appointments: Follow-up Information    [facility]. Schedule an appointment as soon as possible  for a visit in 3 day(s).   Specialty:  Behavioral Health Why:  PLEASE SEE YOUR PSYCHIATRIST, DR. COTTLE WITHIN 1 WEEK OF DISCHARGE TO DISCUSS YOUR MEDICATIONS. Please call your psychiatrist or go to the ED for any changes in mental status.  Contact information: [place] [address] [phone]           Hospital Course by problem list: Principal Problem:   Pancreatitis Active Problems:   Medication overdose   Bipolar 1 disorder (HCC)   Elevated LFTs   1. Pancreatitis [name] is a 50 y/o male who presented for evaluation of nausea, vomiting and abdominal pain with onset the night before. He reports not being able to keep food or drink down since that time. His abdominal pain was localized in the epigastrium and mid-lower-abdomen with radiation to the back. He reports he gets similar episodes to this approximately 1x/year however has never had medical evaluation for this. Labs showed evidence for pancreatitis with transaminitis. No elevated triglycerides and a normal ethanol level. Pt denied any significant alcohol use recently. CT abdomen/pelvis showed peri-pancreatic fat stranding suggestive of acute pancreatitis. Gallbladder without stones. He was treated with bowel rest, aggressive fluid replacement and stopping offending medications (Seroquel). Patient reported complete resolution of his symptoms during hospital day 2 and was discharged home after having good conversation about the importance of a bland diet for the next several days.   2. Medication Overdose  Patient has Hx of Bipolar 1 Disorder and reports taking an entire 1+ months supply of Seroquel over the course of a week as he was "confused on the dosing instructions." Per patient during this time he became very confused, was sweating a lot and felt "off the wall." His last dose of Seroquel was 3-4 days prior to presentation. Per multiple resources Seroquel can cause pancreatitis and elevated LFT's  in the setting of medication misuse. He was evaluated for signs of NMS or serotonin syndrome however denied any muscle rigidity, and he had no changes in DTRs, a normal CK level and no EKG changes consistent with diagnosis. He was monitored closely though however did not develop symptoms.   3. Bipolar 1 Disorder Care was coordinated with patients psychiatrist, Dr. Clovis Pu who recommend we d/c Seroquel and Depakote. He suggested we could start patient on Lithium or Clonidine upon discharge however the patient reported he did not want to take any more medications for his disorder until he was seen by his psychiatrist. Per patient he has an appointment next week with Dr. Clovis Pu. Pt and his wife report he has good awareness of his disease and was instructed to contact his psychiatrist or go to the ED for any manic or depressive symptomology.   Discharge Vitals:   BP 138/81 (BP Location: Left Arm)   Pulse 61   Temp 98.1 F (36.7 C) (Axillary)   Resp 18   Ht 5\' 11"  (1.803 m)   Wt 190 lb (86.2 kg)   SpO2 100%   BMI 26.50 kg/m   Pertinent Labs, Studies, and Procedures:  CMP: Lipase 504, ALT 193, AST 110 and bilirubin 1.6, which improved to normal AST and ALT of 79 on discharge CBC: no leukocytosis CT abdomen/pelvis with contrast: Peri-pancreatic stranding suggestive of acute pancreatitis. No local fluid collection or pseudocyst formation CK: normal UA: no sings of infection  Discharge Instructions: Follow up with your PCP within 1 week. Follow up with your psychiatrist within 1 week. Please go to your scheduled appointment. Please contact your psychiatrist or go to the ED if you have any manic or depressive symptoms.  Please take all medications as directed.   SignedEinar Gip, DO 09/04/2016, 3:42 PM   Pager: [phone]

## 2016-09-04 NOTE — Progress Notes (Signed)
Patient was discharged home by MD order; discharged instructions  review and give to patient with care notes; IV DIC; skin intact; patient will be escorted to the car by nurse via wheelchair.  

## 2016-09-04 NOTE — Discharge Instructions (Signed)
Please eat a BLAND diet for the next several days! Please see your psychiatrist ASAP for an appointment to discuss your medications.  Please contact your psychiatrist OR go to the ED if your mental status changes.   Acute Pancreatitis Acute pancreatitis is a disease in which the pancreas becomes suddenly inflamed. The pancreas is a large gland located behind your stomach. The pancreas produces enzymes that help digest food. The pancreas also releases the hormones glucagon and insulin that help regulate blood sugar. Damage to the pancreas occurs when the digestive enzymes from the pancreas are activated and begin attacking the panceas before being released into the intestine. Most acute attacks last a couple of days and can cause serious complications. Some people become dehydrated and develop low blood pressure. In severe cases, bleeding into the pancreas can lead to shock and can be life-threatening. The lungs, heart, and kidneys may fail. CAUSES  Pancreatitis can happen to anyone. In some cases, the cause is unknown. Most cases are caused by:  Alcohol abuse.  Gallstones. Other less common causes are:  Certain medicines.  Exposure to certain chemicals.  Infection.  Damage caused by an accident (trauma).  Abdominal surgery. SYMPTOMS   Pain in the upper abdomen that may radiate to the back.  Tenderness and swelling of the abdomen.  Nausea and vomiting. DIAGNOSIS  Your caregiver will perform a physical exam. Blood and stool tests may be done to confirm the diagnosis. Imaging tests may also be done, such as X-rays, CT scans, or an ultrasound of the abdomen. TREATMENT  Treatment usually requires a stay in the hospital. Treatment may include:  Pain medicine.  Fluid replacement through an intravenous line (IV).  Placing a tube in the stomach to remove stomach contents and control vomiting.  Not eating for 3 or 4 days. This gives your pancreas a rest, because enzymes are not being  produced that can cause further damage.  Antibiotic medicines if your condition is caused by an infection.  Surgery of the pancreas or gallbladder. HOME CARE INSTRUCTIONS   Follow the diet advised by your caregiver. This may involve avoiding alcohol and decreasing the amount of fat in your diet.  Eat smaller, more frequent meals. This reduces the amount of digestive juices the pancreas produces.  Drink enough fluids to keep your urine clear or pale yellow.  Only take over-the-counter or prescription medicines as directed by your caregiver.  Avoid drinking alcohol if it caused your condition.  Do not smoke.  Get plenty of rest.  Check your blood sugar at home as directed by your caregiver.  Keep all follow-up appointments as directed by your caregiver. SEEK MEDICAL CARE IF:   You do not recover as quickly as expected.  You develop new or worsening symptoms.  You have persistent pain, weakness, or nausea.  You recover and then have another episode of pain. SEEK IMMEDIATE MEDICAL CARE IF:   You are unable to eat or keep fluids down.  Your pain becomes severe.  You have a fever or persistent symptoms for more than 2 to 3 days.  You have a fever and your symptoms suddenly get worse.  Your skin or the white part of your eyes turn yellow (jaundice).  You develop vomiting.  You feel dizzy, or you faint.  Your blood sugar is high (over 300 mg/dL). MAKE SURE YOU:   Understand these instructions.  Will watch your condition.  Will get help right away if you are not doing well or get worse.  This information is not intended to replace advice given to you by your health care provider. Make sure you discuss any questions you have with your health care provider.   Document Released: 12/07/2005 Document Revised: 06/07/2012 Document Reviewed: 03/17/2012 Elsevier Interactive Patient Education [employer].

## 2016-09-04 NOTE — Progress Notes (Addendum)
Subjective:  Scott Daniel was seen and evaluated today at bedside. He has no complaints except he is hungry and wants to eat. Denied any abdominal pain, nausea or vomiting overnight.  Reports he does not want to take any more of his antipsychotic medications until he see's his psychiatrist.   UPDATE: On repeat examination patient tolerated his clear liquid diet well without abdominal pain, nausea or vomiting.   Objective:  Vital signs in last 24 hours: Vitals:   09/03/16 0539 09/03/16 1437 09/03/16 2150 09/04/16 0529  BP: 118/81 137/74 130/76 115/64  Pulse: 65 75 63 (!) 48  Resp: 18 18 18 18   Temp: 98.1 F (36.7 C) 97.3 F (36.3 C) 98.2 F (36.8 C) 97.4 F (36.3 C)  TempSrc: Oral Oral Oral Oral  SpO2: 97% 100% 98% 97%  Weight:      Height:       Scheduled Medications: . enoxaparin (LOVENOX) injection  40 mg Subcutaneous Q24H  . lamoTRIgine  150 mg Oral QHS   Physical Exam  Constitutional: He is oriented to person, place, and time and well-developed, well-nourished, and in no distress.  Resting comfortably in bed.   HENT:  Head: Normocephalic and atraumatic.  Cardiovascular: Normal rate, regular rhythm and intact distal pulses.  Exam reveals no friction rub.   No murmur heard. Pulmonary/Chest: Effort normal and breath sounds normal. No respiratory distress. He has no wheezes. He has no rales.  Abdominal: Soft. Bowel sounds are normal. He exhibits no distension. There is no tenderness. There is no rebound and no guarding.  Neurological: He is alert and oriented to person, place, and time.  Skin: Skin is warm and dry. He is not diaphoretic.  Psychiatric: Mood, memory, affect and judgment normal.    Assessment/Plan:  Principal Problem:   Pancreatitis Active Problems:   Medication overdose   Bipolar 1 disorder (HCC)   Elevated LFTs  Pancreatitis This is likely secondary to patient taking 1+ month supply of Seroquel over the course of 1 week because he was confused on the  dosing instructions. Patient reports his abdominal pain has dissipated and that he feels ready to eat now. He was originally put on a clear liquid diet on admission at his request however had abdominal pain which required the use of morphine. His diet was de-escalated yesterday back to NPO + ice chips with good response. I resumed the clear liquid diet and patient had good response without abdominal pain, nausea or vomiting. Will likely d/c patient today if patient continues to tolerate PO intake well. Good discussion was had with the patient and his wife who was present in the room during examination about eating a bland diet for the next several days.  -Resuming clear liquid diet -CBC without leukocytosis or anemia -CMP shows continued improvement of LFT's (ALT 79, ast normal) -Did show potassium of 3.2 which will be replaced with K-dur prior to patients discharge.  Criss Rosales diet for several days after discharge. Importance of avoiding heavy alcohol consumption or medication overuse was discussed.   Bipolar 1 Disorder Interestingly, the patient states he does not want to take any more of his psych medications this morning until he talks with his psychiatrist. Care was coordinated with patients psychiatrist, Dr. Clovis Pu, who recommended stopping Seroquel and Depakote and starting Lithium or Clonidine upon discharge. Recommended Trazodone if patient has insomnia and he will follow up with the patient in clinic.  -Trazodone QHS PRN insomnia -Patient deferred staring Lithium or Clonidine until he see's his  psychiatrist. Reports he has an appointment next week with his psych physician however we discussed the need to be seen sooner or go to the ED if he starts to feel manic. He and his wife report he has good awareness of his episodes.   Dispo: Anticipated discharge TODAY.  Neri Samek, DO 09/04/2016, 8:34 AM Pager: [phone]

## 2016-09-05 LAB — HEPATITIS B SURFACE ANTIGEN: Hepatitis B Surface Ag: NEGATIVE

## 2017-12-13 IMAGING — CT CT BIOPSY
1 of 4 series · 12 of 32 positions shown, 18 images · non-contrast
Comparison: none

CLINICAL DATA: Right low back pain

[Series 2: si joint injection · axial · 0.71mm/px · z∈[-325,-259]mm · 12 of 27 slices shown, 18 images]
[im 3/27  soft-tissue]
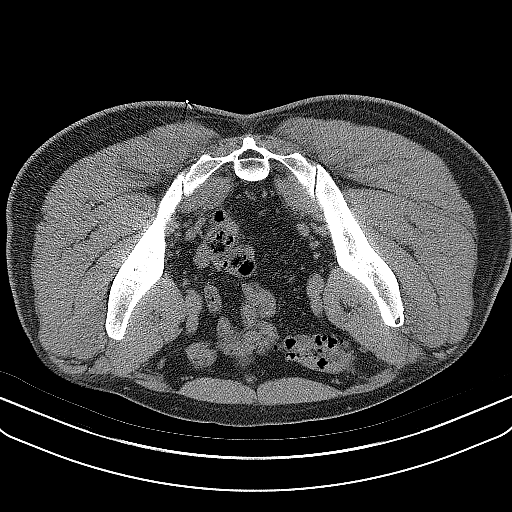
[im 3/27  bone]
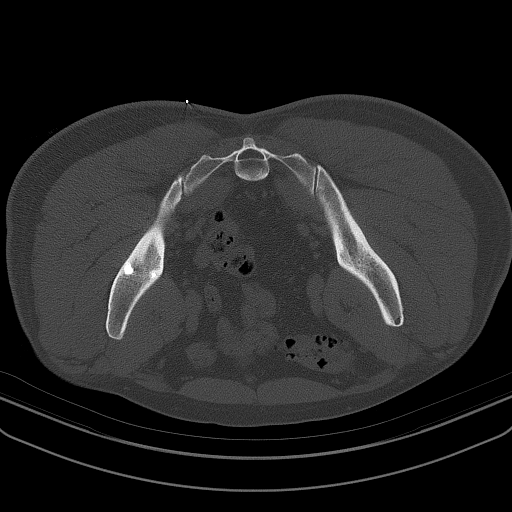
[im 5/27  soft-tissue]
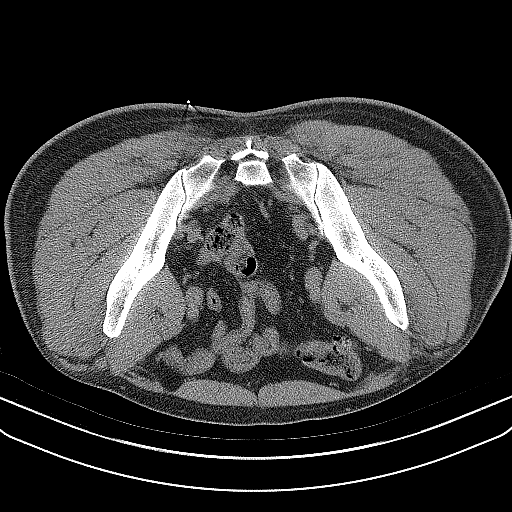
[im 7/27  soft-tissue]
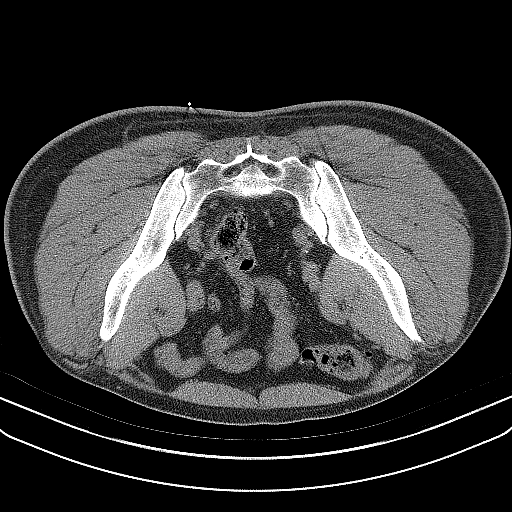
[im 9/27  soft-tissue]
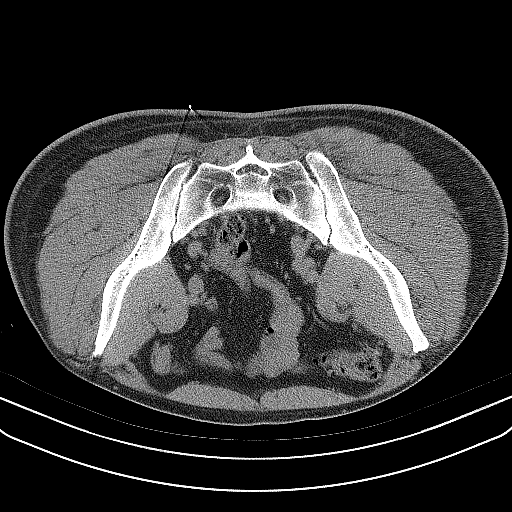
[im 11/27  soft-tissue]
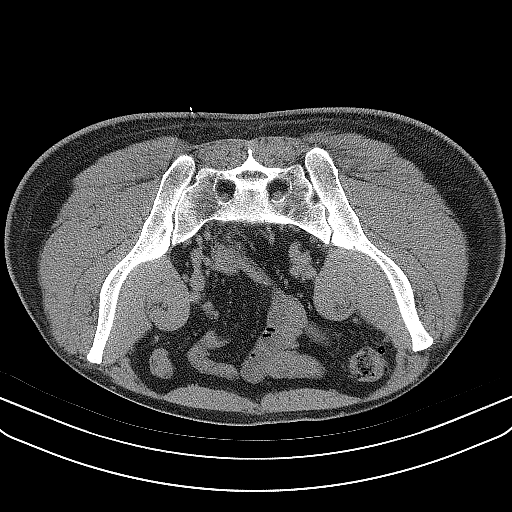
[im 13/27  soft-tissue]
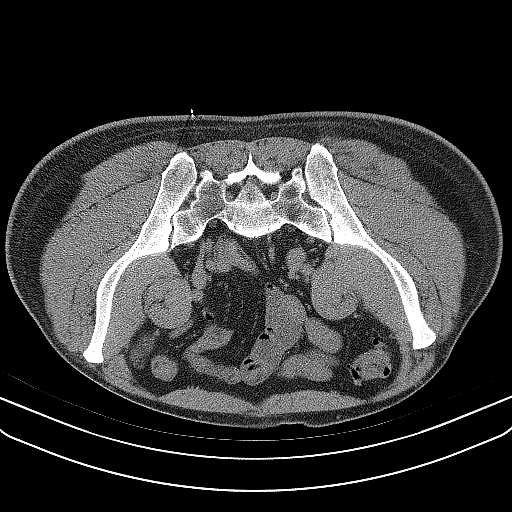
[im 15/27  soft-tissue]
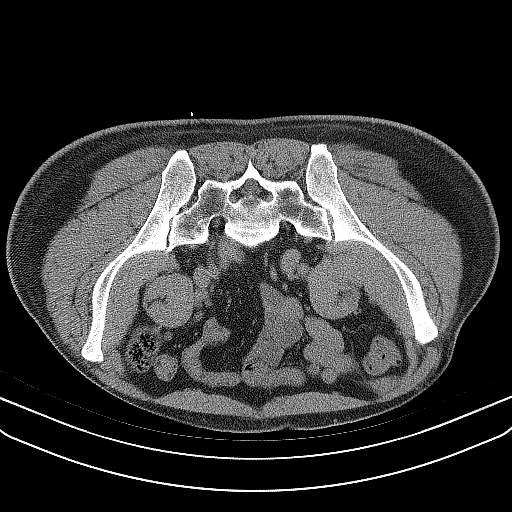
[im 17/27  soft-tissue]
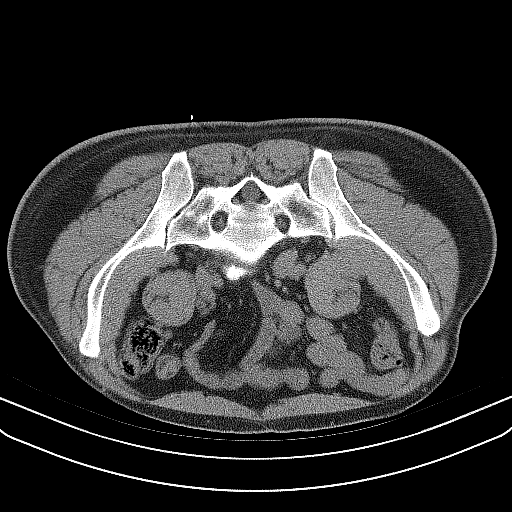
[im 19/27  soft-tissue]
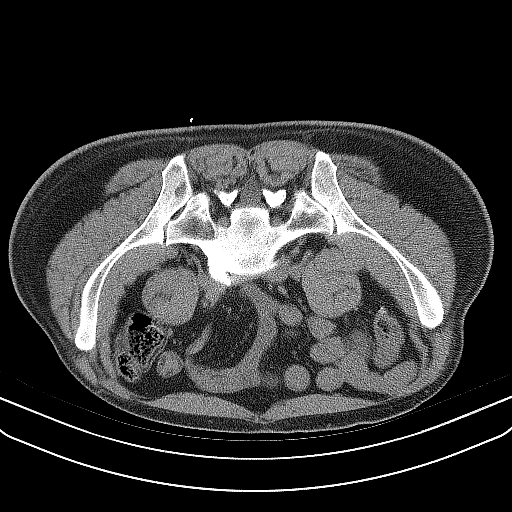
[im 19/27  lung]
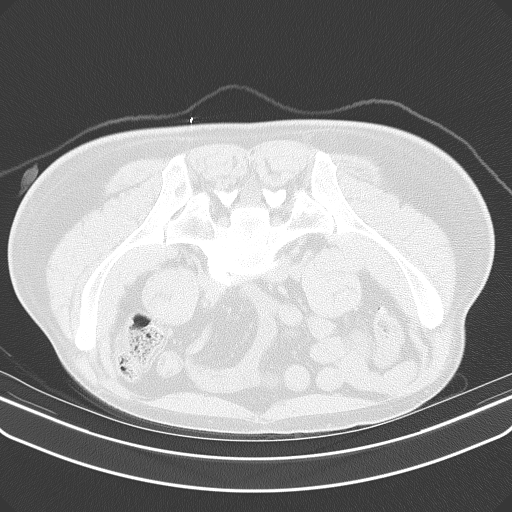
[im 19/27  bone]
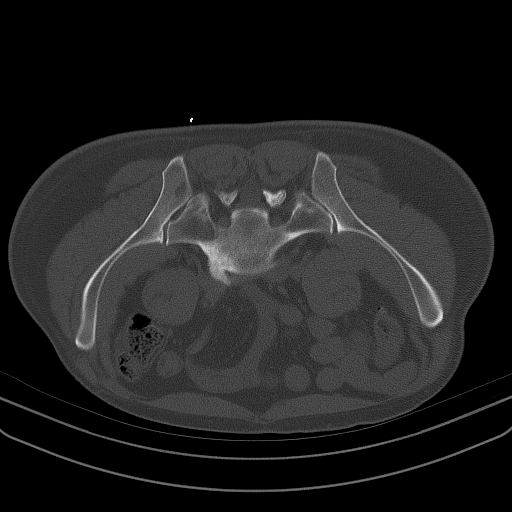
[im 21/27  soft-tissue]
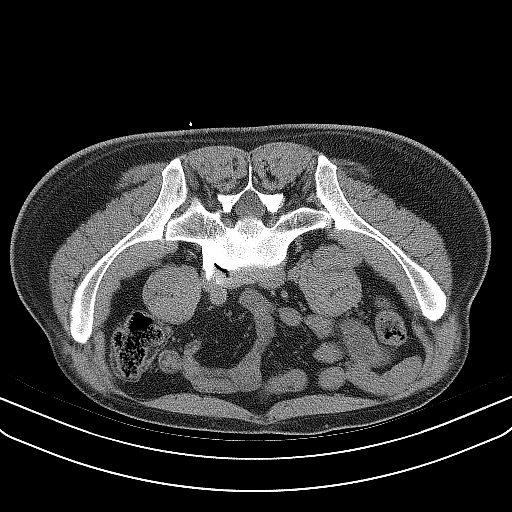
[im 21/27  lung]
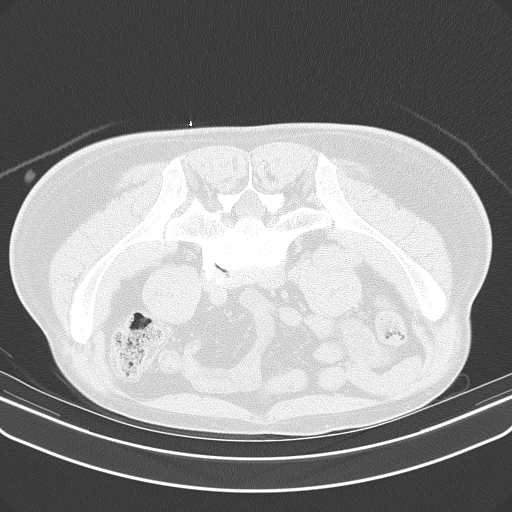
[im 23/27  soft-tissue]
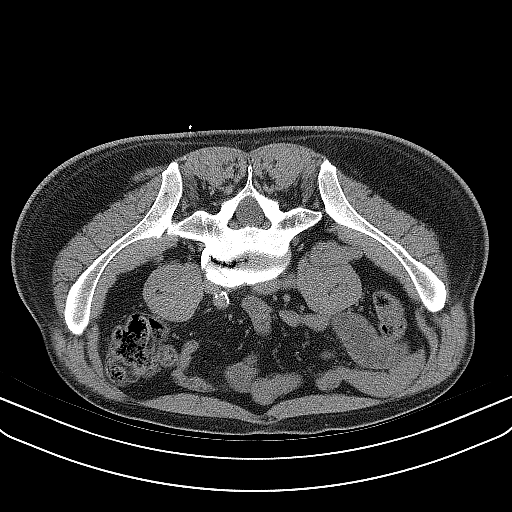
[im 23/27  lung]
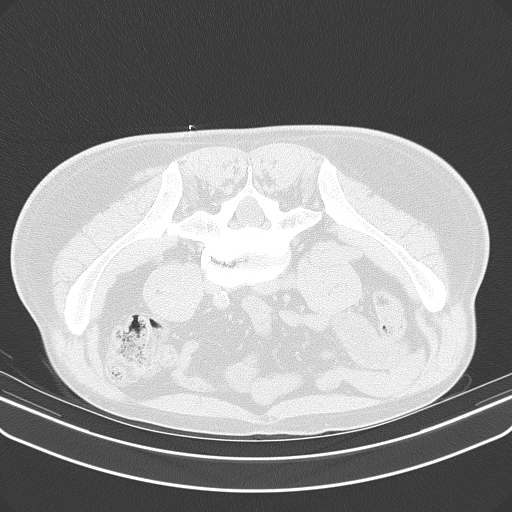
[im 25/27  soft-tissue]
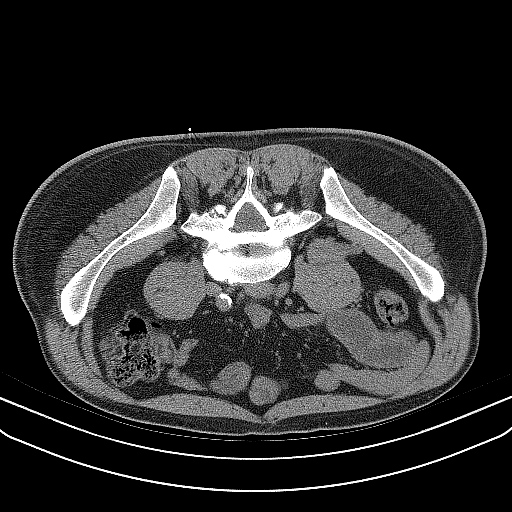
[im 25/27  lung]
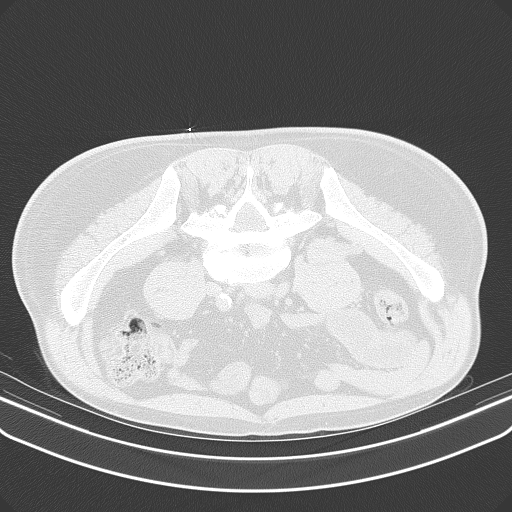

[12 of 32 positions shown; findings below may reference images not displayed]

EXAM:
CT RIGHT SI JOINT INJECTION

FLUOROSCOPY TIME:  None minutes

PROCEDURE:
The procedure, risks, benefits, and alternatives were explained to
the patient. Questions regarding the procedure were encouraged and
answered. The patient understands and consents to the procedure.

Right SACROILIAC JOINT INJECTION: The skin dorsal to the sacroiliac
joint was cleansed and anesthetized. A 22 gauge curved needle was
directed into the inferior aspect of the sacroiliac joint. Intra
articular positioning was confirmed by CT imaging. 3 cc 0.5%
Sensorcaine was injected into the right SI joint.
FINDINGS: Imaging confirms needle placement within the right SI joint.

COMPLICATIONS:
None
IMPRESSION: Technically successful right sacroiliac joint injection with a local
anesthetic.

## 2018-10-23 DIAGNOSIS — F3161 Bipolar disorder, current episode mixed, mild: Secondary | ICD-10-CM | POA: Insufficient documentation

## 2018-10-23 DIAGNOSIS — F411 Generalized anxiety disorder: Secondary | ICD-10-CM | POA: Insufficient documentation

## 2018-11-07 ENCOUNTER — Other Ambulatory Visit: Payer: Self-pay

## 2018-11-07 MED ORDER — LURASIDONE HCL 120 MG PO TABS: 120.0000 mg | ORAL_TABLET | Freq: Every day | ORAL | 0 refills | Status: DC

## 2018-11-09 ENCOUNTER — Ambulatory Visit (INDEPENDENT_AMBULATORY_CARE_PROVIDER_SITE_OTHER): Payer: BLUE CROSS/BLUE SHIELD | Admitting: Physician Assistant

## 2018-11-09 ENCOUNTER — Encounter: Payer: Self-pay | Admitting: Physician Assistant

## 2018-11-09 ENCOUNTER — Telehealth: Payer: Self-pay | Admitting: Physician Assistant

## 2018-11-09 DIAGNOSIS — F17200 Nicotine dependence, unspecified, uncomplicated: Secondary | ICD-10-CM

## 2018-11-09 DIAGNOSIS — F411 Generalized anxiety disorder: Secondary | ICD-10-CM | POA: Diagnosis not present

## 2018-11-09 DIAGNOSIS — F319 Bipolar disorder, unspecified: Secondary | ICD-10-CM | POA: Diagnosis not present

## 2018-11-09 MED ORDER — LURASIDONE HCL 120 MG PO TABS: 120.0000 mg | ORAL_TABLET | Freq: Every day | ORAL | 0 refills | Status: DC

## 2018-11-09 MED ORDER — ALPRAZOLAM 0.5 MG PO TABS: 0.5000 mg | ORAL_TABLET | Freq: Three times a day (TID) | ORAL | 2 refills | Status: DC | PRN

## 2018-11-09 NOTE — Progress Notes (Signed)
[facility]  Patient ID: Scott Daniel,  MRN: [id number]  PCP: Tracie Harrier, MD  Date of Evaluation: 11/09/2018 Time spent:15 minutes  Chief Complaint:  Chief Complaint    Follow-up      HISTORY/CURRENT STATUS: HPI here for 6-week follow-up.  Patient was here 09/14/2018 and had worsening of anxiety.  We started BuSpar.  However he states he did not take it because he started feeling better and did not need it.  He states the anxiety is well controlled with the Xanax alone.  He is not having panic attacks but more of a generalized anxiety that does occur throughout the day.  Xanax is effective.  Patient denies loss of interest in usual activities and is able to enjoy things.  Denies decreased energy or motivation.  Appetite has not changed.  No extreme sadness, tearfulness, or feelings of hopelessness.  Denies any changes in concentration, making decisions or remembering things.  Denies suicidal or homicidal thoughts.  Patient denies increased energy with decreased need for sleep, no increased talkativeness, no racing thoughts, no impulsivity or risky behaviors, no increased spending, no increased libido, no grandiosity.  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  He is unable to do much due to his mental and physical problems.  He still has a lot of back pain when he is active.  Individual Medical History/ Review of Systems: Changes? :No   Allergies: Depakote [divalproex sodium] led to pancreatitis  Current Medications:  Current Outpatient Medications:  .  ALPRAZolam (XANAX) 0.5 MG tablet, Take 1 tablet (0.5 mg total) by mouth 3 (three) times daily as needed for anxiety (1/2-1 tab)., Disp: 90 tablet, Rfl: 2 .  Lurasidone HCl (LATUDA) 120 MG TABS, Take 1 tablet (120 mg total) by mouth daily with supper., Disp: 90 tablet, Rfl: 0 Medication Side Effects: none  Family Medical/ Social History: Changes? No  MENTAL HEALTH  EXAM:  There were no vitals taken for this visit.There is no height or weight on file to calculate BMI.  General Appearance: Casual  Eye Contact:  Good  Speech:  Clear and Coherent  Volume:  Normal  Mood:  Euthymic  Affect:  Flat  Thought Process:  Goal Directed  Orientation:  Full (Time, Place, and Person)  Thought Content: Logical   Suicidal Thoughts:  No  Homicidal Thoughts:  No  Memory:  WNL  Judgement:  Good  Insight:  Good  Psychomotor Activity:  slightly slowed and not swinging arms when he walks as much as is usual  Concentration:  Concentration: Good  Recall:  Good  Fund of Knowledge: Good  Language: Good  Assets:  Desire for Improvement  ADL's:  Intact  Cognition: WNL  Prognosis:  Good    DIAGNOSES:    ICD-10-CM   1. Bipolar I disorder ([place]) F31.9   2. Generalized anxiety disorder F41.1   3. Smoker F17.200     Receiving Psychotherapy: No    RECOMMENDATIONS: Continue Latuda and Xanax as noted above.   Return in 3 months or sooner as needed.  [name], PA-C

## 2019-02-09 ENCOUNTER — Ambulatory Visit: Payer: BLUE CROSS/BLUE SHIELD | Admitting: Physician Assistant

## 2019-02-09 ENCOUNTER — Encounter: Payer: Self-pay | Admitting: Physician Assistant

## 2019-02-09 DIAGNOSIS — F319 Bipolar disorder, unspecified: Secondary | ICD-10-CM

## 2019-02-09 DIAGNOSIS — F411 Generalized anxiety disorder: Secondary | ICD-10-CM

## 2019-02-09 MED ORDER — LURASIDONE HCL 120 MG PO TABS: 120.0000 mg | ORAL_TABLET | Freq: Every day | ORAL | 1 refills | Status: DC

## 2019-02-09 MED ORDER — ALPRAZOLAM 0.5 MG PO TABS: 0.5000 mg | ORAL_TABLET | Freq: Three times a day (TID) | ORAL | 3 refills | Status: DC | PRN

## 2019-02-09 NOTE — Progress Notes (Signed)
[facility]  Patient ID: Scott Daniel,  MRN: [id number]  PCP: Tracie Harrier, MD  Date of Evaluation: 02/09/2019 Time spent:15 minutes  Chief Complaint:  Chief Complaint    Follow-up      HISTORY/CURRENT STATUS: HPI here for 75-month med check.  Seen alone.  Patient denies loss of interest in usual activities and is able to enjoy things.  Denies decreased energy or motivation.  Appetite has not changed.  No extreme sadness, tearfulness, or feelings of hopelessness.  Denies any changes in concentration, making decisions or remembering things.  Denies suicidal or homicidal thoughts.  Patient denies increased energy with decreased need for sleep, no increased talkativeness, no racing thoughts, no impulsivity or risky behaviors, no increased spending, no increased libido, no grandiosity.  Denies muscle or joint pain, stiffness, or dystonia.  Denies dizziness, syncope, seizures, numbness, tingling, tremor, tics, unsteady gait, slurred speech, confusion.   Individual Medical History/ Review of Systems: Changes? :No    Past medications for mental health diagnoses include: Depakote possibly caused pancreatitis, other meds that are unknown. he has had ECT.  Allergies: Depakote [divalproex sodium]  Current Medications:  Current Outpatient Medications:  .  ALPRAZolam (XANAX) 0.5 MG tablet, Take 1 tablet (0.5 mg total) by mouth 3 (three) times daily as needed for anxiety (1/2-1 tab)., Disp: 90 tablet, Rfl: 3 .  Lurasidone HCl (LATUDA) 120 MG TABS, Take 1 tablet (120 mg total) by mouth daily with supper., Disp: 90 tablet, Rfl: 1 Medication Side Effects: none  Family Medical/ Social History: Changes? No  MENTAL HEALTH EXAM:  There were no vitals taken for this visit.There is no height or weight on file to calculate BMI.  General Appearance: Casual and Well Groomed  Eye Contact:  Good  Speech:  Clear and Coherent  Volume:  Normal  Mood:  Euthymic  Affect:  Flat   Thought Process:  Goal Directed  Orientation:  Full (Time, Place, and Person)  Thought Content: Logical   Suicidal Thoughts:  No  Homicidal Thoughts:  No  Memory:  WNL  Judgement:  Good  Insight:  Good  Psychomotor Activity:  Normal  Concentration:  Concentration: Good  Recall:  Good  Fund of Knowledge: Good  Language: Good  Assets:  Desire for Improvement  ADL's:  Intact  Cognition: WNL  Prognosis:  Good    DIAGNOSES:    ICD-10-CM   1. Bipolar I disorder ([place]) F31.9   2. Generalized anxiety disorder F41.1     Receiving Psychotherapy: No    RECOMMENDATIONS: Continue Latuda 120 mg p.o. q. evening with food. Continue Xanax 0.5 mg, half to 1 p.o. 3 times daily as needed. He had labs within the past few months at his PCPs office.  He will sign a release today so that I can get those results. Return in 4 months or sooner as needed.  [name], PA-C   This record has been created using [employer].  Chart creation errors have been sought, but may not always have been located and corrected. Such creation errors do not reflect on the standard of medical care.

## 2019-04-25 ENCOUNTER — Other Ambulatory Visit: Payer: Self-pay | Admitting: Physician Assistant

## 2019-04-25 DIAGNOSIS — Z79899 Other long term (current) drug therapy: Secondary | ICD-10-CM

## 2019-06-14 ENCOUNTER — Other Ambulatory Visit: Payer: Self-pay

## 2019-06-14 ENCOUNTER — Encounter: Payer: Self-pay | Admitting: Physician Assistant

## 2019-06-14 ENCOUNTER — Ambulatory Visit: Payer: BLUE CROSS/BLUE SHIELD | Admitting: Physician Assistant

## 2019-06-14 DIAGNOSIS — F411 Generalized anxiety disorder: Secondary | ICD-10-CM

## 2019-06-14 DIAGNOSIS — F319 Bipolar disorder, unspecified: Secondary | ICD-10-CM | POA: Diagnosis not present

## 2019-06-14 DIAGNOSIS — Z716 Tobacco abuse counseling: Secondary | ICD-10-CM

## 2019-06-14 DIAGNOSIS — F17200 Nicotine dependence, unspecified, uncomplicated: Secondary | ICD-10-CM | POA: Diagnosis not present

## 2019-06-14 MED ORDER — NALTREXONE HCL 50 MG PO TABS: 50.0000 mg | ORAL_TABLET | Freq: Every day | ORAL | 1 refills | Status: DC

## 2019-06-14 NOTE — Progress Notes (Signed)
[facility]  Patient ID: Scott Daniel,  MRN: [id number]  PCP: Tracie Harrier, MD  Date of Evaluation: 06/14/2019 Time spent:25 minutes  Chief Complaint:  Chief Complaint    Nicotine Dependence      HISTORY/CURRENT STATUS: HPI Wants to quit smoking.  He has been smoking "all my adult life."  He would like to quit.  He is recently gone up to 1-1/2 packs a day.  He has tried NicoDerm patches in the past without success.  He is also taken Wellbutrin although not for smoking cessation but states it did not decrease the cravings.  He took Chantix which caused nightmares, caused worsening of depression as well as suicidal thoughts.  Of importance is that he was hospitalized several weeks ago for acute pancreatitis again.  He states he does not drink alcohol very often at all.  He had pancreatitis once before that we thought was related to Depakote.  He has not taken that in several years.  As far as his moods go, he is doing well.  He is able to enjoy things.  He and his family just went to the beach on vacation and had a good time.  He also plays golf fairly regularly.  His energy and motivation are good.  He is not isolating any more than he has had to due to the coronavirus pandemic.  He does not cry easily.  Patient denies increased energy with decreased need for sleep, no increased talkativeness, no racing thoughts, no impulsivity or risky behaviors, no increased spending, no increased libido, no grandiosity.  Individual Medical History/ Review of Systems: Changes? :Yes  See above  Past medications for mental health diagnoses include: Chantix, Wellbutrin, Nicoderm Patches, Depakote possibly caused pancreatitis, other meds that are unknown. he has had ECT  Allergies: Chantix [varenicline tartrate] and Depakote [divalproex sodium]  Current Medications:  Current Outpatient Medications:  .  ALPRAZolam (XANAX) 0.5 MG tablet, Take 1 tablet (0.5 mg total) by mouth 3  (three) times daily as needed for anxiety (1/2-1 tab)., Disp: 90 tablet, Rfl: 3 .  Lurasidone HCl (LATUDA) 120 MG TABS, Take 1 tablet (120 mg total) by mouth daily with supper., Disp: 90 tablet, Rfl: 1 .  naltrexone (DEPADE) 50 MG tablet, Take 1 tablet (50 mg total) by mouth daily., Disp: 30 tablet, Rfl: 1 Medication Side Effects: none  Family Medical/ Social History: Changes? No  MENTAL HEALTH EXAM:  There were no vitals taken for this visit.There is no height or weight on file to calculate BMI.  General Appearance: Casual  Eye Contact:  Good  Speech:  Clear and Coherent  Volume:  Normal  Mood:  Euthymic  Affect:  Flat  Thought Process:  Goal Directed  Orientation:  Full (Time, Place, and Person)  Thought Content: Logical   Suicidal Thoughts:  No  Homicidal Thoughts:  No  Memory:  WNL  Judgement:  Good  Insight:  Good  Psychomotor Activity:  Normal  Concentration:  Concentration: Good  Recall:  Good  Fund of Knowledge: Good  Language: Good  Assets:  Desire for Improvement  ADL's:  Intact  Cognition: WNL  Prognosis:  Good    DIAGNOSES:    ICD-10-CM   1. Smoker  F17.200   2. Encounter for smoking cessation counseling  Z71.6   3. Bipolar I disorder ([place])  F31.9   4. Generalized anxiety disorder  F41.1     Receiving Psychotherapy: No    RECOMMENDATIONS: I spent 25 minutes with him and  50% of that time was in counseling concerning options for smoking cessation. Chantix is out of the question because it worsened his mood and caused suicidal ideations.  Since Wellbutrin did not decrease cravings when he took it for depression, I do not think it would be effective right now.  We discussed some things that he can do such as decrease the number of cigarettes that he smokes daily to wean off.  For example decrease the amount daily to 29 cigarettes for 1 week, then decrease to 28 cigarettes a day for 1 week, then 27 cigarettes daily for 1 week and then on and on.  He would like to  change to vaping instead of using the cigarette to wean down.  That way he can know how much nicotine he is getting each day.  That is fine for him to try as well.  We also discussed using naltrexone off label.  He understands that this is not a drug to be taken with opiates as it will prevent opioid.  He does not take at this time but knows to make other providers aware this.  He understands that it is an off label use and not FDA approved for smoking cessation.  However it can decrease cravings for food and opiates so it is definitely worth a try. Start naltrexone 50 mg every morning. Continue Latuda 120 mg q. evening with food. Continue Xanax 0.5 mg 3 times daily as needed. Return in 6 weeks.  [name], PA-C   This record has been created using [employer].  Chart creation errors have been sought, but may not always have been located and corrected. Such creation errors do not reflect on the standard of medical care.

## 2019-07-26 ENCOUNTER — Ambulatory Visit: Payer: BC Managed Care – PPO | Admitting: Physician Assistant

## 2019-09-05 ENCOUNTER — Ambulatory Visit: Payer: BC Managed Care – PPO | Admitting: Physician Assistant

## 2019-10-03 ENCOUNTER — Ambulatory Visit: Payer: BC Managed Care – PPO | Admitting: Physician Assistant

## 2019-10-25 ENCOUNTER — Other Ambulatory Visit: Payer: Self-pay | Admitting: Physician Assistant

## 2019-10-31 ENCOUNTER — Ambulatory Visit (INDEPENDENT_AMBULATORY_CARE_PROVIDER_SITE_OTHER): Payer: BC Managed Care – PPO | Admitting: Physician Assistant

## 2019-10-31 ENCOUNTER — Other Ambulatory Visit: Payer: Self-pay

## 2019-10-31 ENCOUNTER — Encounter: Payer: Self-pay | Admitting: Physician Assistant

## 2019-10-31 DIAGNOSIS — F319 Bipolar disorder, unspecified: Secondary | ICD-10-CM

## 2019-10-31 DIAGNOSIS — F17200 Nicotine dependence, unspecified, uncomplicated: Secondary | ICD-10-CM | POA: Diagnosis not present

## 2019-10-31 DIAGNOSIS — F411 Generalized anxiety disorder: Secondary | ICD-10-CM | POA: Diagnosis not present

## 2019-10-31 MED ORDER — LATUDA 20 MG PO TABS: 20.0000 mg | ORAL_TABLET | Freq: Every day | ORAL | 1 refills | Status: DC

## 2019-10-31 MED ORDER — ALPRAZOLAM 0.5 MG PO TABS: 0.2500 mg | ORAL_TABLET | Freq: Three times a day (TID) | ORAL | 1 refills | Status: DC | PRN

## 2019-10-31 NOTE — Progress Notes (Signed)
[facility]  Patient ID: Scott Daniel,  MRN: [id number]  PCP: Tracie Harrier, MD  Date of Evaluation: 10/31/2019 Time spent:15 minutes  Chief Complaint:  Chief Complaint    Headache; Insomnia; Follow-up      HISTORY/CURRENT STATUS: HPI  For routine med check.  He went off the [place] in early Sept.  "I was having panic attacks starting a few hours after I took my meds and the only thing that helped would be to go to bed."  When he would get up the next morning he would feel fine.  Took med w/ food at supper time.  Denies any panic attacks or generalized anxiety since going off of the [place].  Since stopping the Latuda, he has had a generalized headache every day.  Also complains of abdominal pain with bloating, increased burping, and flatulence.  States he is doing well mentally.  Not working and he has a disability hearing in January.  Sleeps well most of the time.  He does wake up a couple of times a night occasionally and has to go to the bathroom but is able to go back to sleep.  Patient denies loss of interest in usual activities and is able to enjoy things.  Denies decreased energy or motivation.  Appetite has not changed.  No extreme sadness, tearfulness, or feelings of hopelessness.  Denies any changes in concentration, making decisions or remembering things.  Denies suicidal or homicidal thoughts.  Patient denies increased energy with decreased need for sleep, no increased talkativeness, no racing thoughts, no impulsivity or risky behaviors, no increased spending, no increased libido, no grandiosity.  Denies dizziness, syncope, seizures, numbness, tingling, tremor, tics, unsteady gait, slurred speech, confusion. Denies muscle or joint pain, stiffness, or dystonia.  Individual Medical History/ Review of Systems: Changes? :No    Past medications for mental health diagnoses include: Chantix, Wellbutrin, Nicoderm Patches, Depakote possibly caused  pancreatitis,other meds that are unknown.he has had ECT  Allergies: Chantix [varenicline tartrate] and Depakote [divalproex sodium]  Current Medications:  Current Outpatient Medications:  .  ALPRAZolam (XANAX) 0.5 MG tablet, Take 0.5-1 tablets (0.25-0.5 mg total) by mouth 3 (three) times daily as needed for anxiety., Disp: 90 tablet, Rfl: 1 .  lurasidone (LATUDA) 20 MG TABS tablet, Take 1 tablet (20 mg total) by mouth daily with supper., Disp: 30 tablet, Rfl: 1 Medication Side Effects: none  Family Medical/ Social History: Changes? No  MENTAL HEALTH EXAM:  There were no vitals taken for this visit.There is no height or weight on file to calculate BMI.  General Appearance: Casual, Neat and Well Groomed  Eye Contact:  Good  Speech:  Clear and Coherent and Talkative  Volume:  Normal  Mood:  Euthymic  Affect:  Appropriate  Thought Process:  Goal Directed and Descriptions of Associations: Intact  Orientation:  Full (Time, Place, and Person)  Thought Content: Logical   Suicidal Thoughts:  No  Homicidal Thoughts:  No  Memory:  WNL  Judgement:  Good  Insight:  Good  Psychomotor Activity:  Normal  Concentration:  Concentration: Good  Recall:  Good  Fund of Knowledge: Good  Language: Good  Assets:  Desire for Improvement  ADL's:  Intact  Cognition: WNL  Prognosis:  Good    DIAGNOSES:    ICD-10-CM   1. Bipolar I disorder ([place])  F31.9   2. Generalized anxiety disorder  F41.1   3. Smoker  F17.200     Receiving Psychotherapy: No    RECOMMENDATIONS:  He really needs to be on a mood stabilizer or an atypical antipsychotic.  He agrees.  He is willing to retry the Latuda at a lower dose. Start Latuda 20 mg 1 q. evening with meal.  2 weeks of samples given and a co-pay card. Restart Xanax 0.5 mg, 1/2-1 3 times daily as needed. He will see his PCP next week to discuss the abdominal pain as well as headache.  I think that the symptoms will improve after getting back on the Latuda.   He knows to go to the emergency room if he should have weakness, blurred vision, slurred speech, chest pain that radiates to his arm or neck, nausea or vomiting or other life-threatening symptoms. Return in 4 weeks.  [name], PA-C

## 2019-11-07 ENCOUNTER — Other Ambulatory Visit: Payer: Self-pay | Admitting: Internal Medicine

## 2019-11-07 ENCOUNTER — Telehealth: Payer: Self-pay | Admitting: *Deleted

## 2019-11-07 DIAGNOSIS — R1010 Upper abdominal pain, unspecified: Secondary | ICD-10-CM

## 2019-11-21 ENCOUNTER — Ambulatory Visit
Admission: RE | Admit: 2019-11-21 | Discharge: 2019-11-21 | Disposition: A | Discharge status: Home / Self Care | Payer: BC Managed Care – PPO | Source: Ambulatory Visit | Attending: Internal Medicine | Admitting: Internal Medicine

## 2019-11-21 ENCOUNTER — Other Ambulatory Visit: Payer: Self-pay

## 2019-11-21 DIAGNOSIS — R1010 Upper abdominal pain, unspecified: Secondary | ICD-10-CM | POA: Diagnosis not present

## 2019-11-21 MED ORDER — IOHEXOL 300 MG/ML  SOLN
100.0000 mL | Freq: Once | INTRAMUSCULAR | Status: AC | PRN
  Administered 2019-11-21: 11:00:00 100 mL via INTRAVENOUS

## 2019-11-28 ENCOUNTER — Ambulatory Visit: Payer: BC Managed Care – PPO | Admitting: Physician Assistant

## 2019-11-29 ENCOUNTER — Emergency Department
Admission: EM | Admit: 2019-11-29 | Discharge: 2019-11-29 | Disposition: A | Discharge status: Home / Self Care | Payer: BC Managed Care – PPO | Attending: Emergency Medicine | Admitting: Emergency Medicine

## 2019-11-29 ENCOUNTER — Emergency Department: Payer: BC Managed Care – PPO

## 2019-11-29 ENCOUNTER — Other Ambulatory Visit: Payer: Self-pay

## 2019-11-29 ENCOUNTER — Encounter: Payer: Self-pay | Admitting: Emergency Medicine

## 2019-11-29 DIAGNOSIS — K2900 Acute gastritis without bleeding: Secondary | ICD-10-CM | POA: Insufficient documentation

## 2019-11-29 DIAGNOSIS — R5383 Other fatigue: Secondary | ICD-10-CM | POA: Insufficient documentation

## 2019-11-29 DIAGNOSIS — J111 Influenza due to unidentified influenza virus with other respiratory manifestations: Secondary | ICD-10-CM

## 2019-11-29 DIAGNOSIS — R079 Chest pain, unspecified: Secondary | ICD-10-CM | POA: Diagnosis present

## 2019-11-29 DIAGNOSIS — R1084 Generalized abdominal pain: Secondary | ICD-10-CM | POA: Diagnosis not present

## 2019-11-29 DIAGNOSIS — Z20828 Contact with and (suspected) exposure to other viral communicable diseases: Secondary | ICD-10-CM | POA: Diagnosis not present

## 2019-11-29 DIAGNOSIS — M7918 Myalgia, other site: Secondary | ICD-10-CM | POA: Insufficient documentation

## 2019-11-29 DIAGNOSIS — Z96641 Presence of right artificial hip joint: Secondary | ICD-10-CM | POA: Diagnosis not present

## 2019-11-29 DIAGNOSIS — Z79899 Other long term (current) drug therapy: Secondary | ICD-10-CM | POA: Diagnosis not present

## 2019-11-29 DIAGNOSIS — F17210 Nicotine dependence, cigarettes, uncomplicated: Secondary | ICD-10-CM | POA: Diagnosis not present

## 2019-11-29 DIAGNOSIS — R112 Nausea with vomiting, unspecified: Secondary | ICD-10-CM | POA: Diagnosis not present

## 2019-11-29 DIAGNOSIS — R197 Diarrhea, unspecified: Secondary | ICD-10-CM | POA: Diagnosis not present

## 2019-11-29 DIAGNOSIS — Z85828 Personal history of other malignant neoplasm of skin: Secondary | ICD-10-CM | POA: Diagnosis not present

## 2019-11-29 LAB — CBC WITH DIFFERENTIAL/PLATELET
Abs Immature Granulocytes: 0.09 10*3/uL — ABNORMAL HIGH (ref 0.00–0.07)
Basophils Absolute: 0 10*3/uL (ref 0.0–0.1)
Basophils Relative: 0 %
Eosinophils Absolute: 0 10*3/uL (ref 0.0–0.5)
Eosinophils Relative: 0 %
HCT: 50.6 % (ref 39.0–52.0)
Hemoglobin: 18.2 g/dL — ABNORMAL HIGH (ref 13.0–17.0)
Immature Granulocytes: 1 %
Lymphocytes Relative: 7 %
Lymphs Abs: 1.1 10*3/uL (ref 0.7–4.0)
MCH: 30.8 pg (ref 26.0–34.0)
MCHC: 36 g/dL (ref 30.0–36.0)
MCV: 85.8 fL (ref 80.0–100.0)
Monocytes Absolute: 0.6 10*3/uL (ref 0.1–1.0)
Monocytes Relative: 4 %
Neutro Abs: 13.2 10*3/uL — ABNORMAL HIGH (ref 1.7–7.7)
Neutrophils Relative %: 88 %
Platelets: 211 10*3/uL (ref 150–400)
RBC: 5.9 MIL/uL — ABNORMAL HIGH (ref 4.22–5.81)
RDW: 13.2 % (ref 11.5–15.5)
WBC: 15.1 10*3/uL — ABNORMAL HIGH (ref 4.0–10.5)
nRBC: 0 % (ref 0.0–0.2)

## 2019-11-29 LAB — COMPREHENSIVE METABOLIC PANEL
ALT: 48 U/L — ABNORMAL HIGH (ref 0–44)
AST: 25 U/L (ref 15–41)
Albumin: 4.7 g/dL (ref 3.5–5.0)
Alkaline Phosphatase: 61 U/L (ref 38–126)
Anion gap: 10 (ref 5–15)
BUN: 16 mg/dL (ref 6–20)
CO2: 24 mmol/L (ref 22–32)
Calcium: 9.7 mg/dL (ref 8.9–10.3)
Chloride: 103 mmol/L (ref 98–111)
Creatinine, Ser: 1.07 mg/dL (ref 0.61–1.24)
GFR calc Af Amer: >60 mL/min (ref >60)
GFR calc non Af Amer: >60 mL/min (ref >60)
Glucose, Bld: 204 mg/dL — ABNORMAL HIGH (ref 70–99)
Potassium: 5.5 mmol/L — ABNORMAL HIGH (ref 3.5–5.1)
Sodium: 137 mmol/L (ref 135–145)
Total Bilirubin: 0.9 mg/dL (ref 0.3–1.2)
Total Protein: 7.9 g/dL (ref 6.5–8.1)

## 2019-11-29 LAB — POC SARS CORONAVIRUS 2 AG: SARS Coronavirus 2 Ag: NEGATIVE

## 2019-11-29 LAB — LIPASE, BLOOD: Lipase: 45 U/L (ref 11–51)

## 2019-11-29 MED ORDER — ONDANSETRON 4 MG PO TBDP: 4.0000 mg | ORAL_TABLET | Freq: Three times a day (TID) | ORAL | 0 refills | Status: DC | PRN

## 2019-11-29 MED ORDER — FAMOTIDINE 20 MG PO TABS: 20.0000 mg | ORAL_TABLET | Freq: Two times a day (BID) | ORAL | 0 refills | Status: AC

## 2019-11-29 MED ORDER — ONDANSETRON HCL 4 MG/2ML IJ SOLN
4.0000 mg | Freq: Once | INTRAMUSCULAR | Status: AC
  Administered 2019-11-29: 4 mg via INTRAVENOUS
  Filled 2019-11-29: qty 2

## 2019-11-29 MED ORDER — SODIUM CHLORIDE 0.9 % IV BOLUS
1000.0000 mL | Freq: Once | INTRAVENOUS | Status: AC
  Administered 2019-11-29: 1000 mL via INTRAVENOUS

## 2019-11-29 MED ORDER — NAPROXEN 500 MG PO TABS: 500.0000 mg | ORAL_TABLET | Freq: Two times a day (BID) | ORAL | 0 refills | Status: DC

## 2019-11-29 MED ORDER — KETOROLAC TROMETHAMINE 30 MG/ML IJ SOLN
15.0000 mg | INTRAMUSCULAR | Status: AC
  Administered 2019-11-29: 15 mg via INTRAVENOUS
  Filled 2019-11-29: qty 1

## 2019-11-29 MED ORDER — PANTOPRAZOLE SODIUM 40 MG IV SOLR
40.0000 mg | Freq: Once | INTRAVENOUS | Status: AC
  Administered 2019-11-29: 40 mg via INTRAVENOUS
  Filled 2019-11-29: qty 40

## 2019-11-29 NOTE — ED Notes (Signed)
Pt given crackers, peanut butter, and ginger ale to try eating.

## 2019-11-29 NOTE — ED Triage Notes (Signed)
Pt reports last PM started with CP, abd pain, NVD and thinks he may have COVID.

## 2019-11-29 NOTE — ED Provider Notes (Signed)
[facility] Emergency Department Provider Note  ____________________________________________  Time seen: Approximately 11:24 AM  I have reviewed the triage vital signs and the nursing notes.   HISTORY  Chief Complaint Sore Throat, Chest Pain, Diarrhea, Nausea, and Abdominal Pain    HPI Scott Daniel is a 53 y.o. male with a history of chronic low back pain, pancreatitis, atrial fibrillation, bipolar disorder who reports he was in his usual state of health until about 3:00 AM when he started having anterior chest pain that is nonradiating without aggravating or alleviating factors, diffuse abdominal pain, nausea vomiting and diarrhea.  Denies black or bloody stool or hematemesis.  He is worried that he might have Covid.  He does also have chills and body aches and fatigue.  Symptoms are constant.  No exertional symptoms.      Past Medical History:  Diagnosis Date  . Arthritis    "qwhere" (09/02/2016)  . Bipolar 1 disorder ([place])   . Chronic lower back pain   . Pancreatitis 09/02/2016  . Pre-diabetes   . Skin cancer    "behind my left ear"     Patient Active Problem List   Diagnosis Date Noted  . Mild mixed bipolar I disorder ([place]) 10/23/2018  . Generalized anxiety disorder 10/23/2018  . Pancreatitis 09/02/2016  . Medication overdose 09/02/2016  . Bipolar 1 disorder ([place]) 09/02/2016  . Elevated LFTs 09/02/2016     Past Surgical History:  Procedure Laterality Date  . SKIN CANCER EXCISION Left    "behind my ear"  . TOTAL HIP ARTHROPLASTY Right ~ 2008     Prior to Admission medications   Medication Sig Start Date End Date Taking? Authorizing Provider  ALPRAZolam Duanne Moron) 0.5 MG tablet Take 0.5-1 tablets (0.25-0.5 mg total) by mouth 3 (three) times daily as needed for anxiety. 10/31/19  Yes Hurst, Teresa T, PA-C  lurasidone (LATUDA) 20 MG TABS tablet Take 1 tablet (20 mg total) by mouth daily with supper. 10/31/19  Yes [name] T, PA-C   famotidine (PEPCID) 20 MG tablet Take 1 tablet (20 mg total) by mouth 2 (two) times daily. 11/29/19   Carrie Mew, MD  naproxen (NAPROSYN) 500 MG tablet Take 1 tablet (500 mg total) by mouth 2 (two) times daily with a meal. 11/29/19   Carrie Mew, MD  ondansetron (ZOFRAN ODT) 4 MG disintegrating tablet Take 1 tablet (4 mg total) by mouth every 8 (eight) hours as needed for nausea or vomiting. 11/29/19   Carrie Mew, MD     Allergies Chantix [varenicline tartrate] and Depakote [divalproex sodium]   No family history on file.  Social History Social History   Tobacco Use  . Smoking status: Current Every Day Smoker    Packs/day: 1.50    Years: 32.00    Pack years: 48.00    Types: Cigarettes  . Smokeless tobacco: Former Systems developer    Quit date: 02/09/2019  Substance Use Topics  . Alcohol use: Yes    Alcohol/week: 0.0 - 1.0 standard drinks  . Drug use: Not Currently    Types: Marijuana    Comment: quit 10/20    Review of Systems  Constitutional:   No fever or chills.  ENT:   No sore throat. No rhinorrhea. Cardiovascular: Positive as above chest pain without syncope. Respiratory:   No dyspnea or cough. Gastrointestinal:   Positive as above for abdominal pain, vomiting and diarrhea.  Musculoskeletal:   Negative for focal pain or swelling All other systems reviewed and are negative  except as documented above in ROS and HPI.  ____________________________________________   PHYSICAL EXAM:  VITAL SIGNS: ED Triage Vitals  Enc Vitals Group     BP 11/29/19 1030 (!) 162/94     Pulse Rate 11/29/19 1029 (!) 58     Resp 11/29/19 1029 16     Temp --      Temp src --      SpO2 11/29/19 1029 100 %     Weight 11/29/19 0932 170 lb (77.1 kg)     Height 11/29/19 0932 6' (1.829 m)     Head Circumference --      Peak Flow --      Pain Score 11/29/19 0932 6     Pain Loc --      Pain Edu? --      Excl. in [place]? --     Vital signs reviewed, nursing assessments  reviewed.   Constitutional:   Alert and oriented. Non-toxic appearance. Eyes:   Conjunctivae are normal. EOMI. PERRL. ENT      Head:   Normocephalic and atraumatic.      Nose:   Wearing a mask.      Mouth/Throat:   Wearing a mask.      Neck:   No meningismus. Full ROM. Hematological/Lymphatic/Immunilogical:   No cervical lymphadenopathy. Cardiovascular:   RRR. Symmetric bilateral radial and DP pulses.  No murmurs. Cap refill less than 2 seconds. Respiratory:   Normal respiratory effort without tachypnea/retractions. Breath sounds are clear and equal bilaterally. No wheezes/rales/rhonchi. Gastrointestinal:   Soft with mild generalized tenderness, nonfocal Non distended. There is no CVA tenderness.  No rebound, rigidity, or guarding.  Musculoskeletal: Chest wall tender to the touch reproducing the pain.  Normal range of motion in all extremities. No joint effusions.  No lower extremity tenderness.  No edema. Neurologic:   Normal speech and language.  Motor grossly intact. No acute focal neurologic deficits are appreciated.  Skin:    Skin is warm, dry and intact. No rash noted.  No petechiae, purpura, or bullae.  ____________________________________________    LABS (pertinent positives/negatives) (all labs ordered are listed, but only abnormal results are displayed) Labs Reviewed  COMPREHENSIVE METABOLIC PANEL - Abnormal; Notable for the following components:      Result Value   Potassium 5.5 (*)    Glucose, Bld 204 (*)    ALT 48 (*)    All other components within normal limits  CBC WITH DIFFERENTIAL/PLATELET - Abnormal; Notable for the following components:   WBC 15.1 (*)    RBC 5.90 (*)    Hemoglobin 18.2 (*)    Neutro Abs 13.2 (*)    Abs Immature Granulocytes 0.09 (*)    All other components within normal limits  LIPASE, BLOOD  POC SARS CORONAVIRUS 2 AG -  ED  POC SARS CORONAVIRUS 2 AG   ____________________________________________   EKG  Interpreted by me Atrial  fibrillation rate of 60, normal axis intervals QRS ST segments and T waves  ____________________________________________    RADIOLOGY  No results found.  ____________________________________________   PROCEDURES Procedures  ____________________________________________  DIFFERENTIAL DIAGNOSIS   Viral syndrome, dehydration, electrolyte abnormality  CLINICAL IMPRESSION / ASSESSMENT AND PLAN / ED COURSE  Medications ordered in the ED: Medications  ondansetron (ZOFRAN) injection 4 mg (4 mg Intravenous Given 11/29/19 1133)  sodium chloride 0.9 % bolus 1,000 mL (1,000 mLs Intravenous New Bag/Given 11/29/19 1137)  ketorolac (TORADOL) 30 MG/ML injection 15 mg (15 mg Intravenous Given 11/29/19 1133)  pantoprazole (PROTONIX) injection 40 mg (40 mg Intravenous Given 11/29/19 1134)    Pertinent labs & imaging results that were available during my care of the patient were reviewed by me and considered in my medical decision making (see chart for details).  LENDON LANTIS was evaluated in Emergency Department on 11/29/2019 for the symptoms described in the history of present illness. He was evaluated in the context of the global COVID-19 pandemic, which necessitated consideration that the patient might be at risk for infection with the SARS-CoV-2 virus that causes COVID-19. Institutional protocols and algorithms that pertain to the evaluation of patients at risk for COVID-19 are in a state of rapid change based on information released by regulatory bodies including the CDC and federal and state organizations. These policies and algorithms were followed during the patient's care in the ED.   Patient presents with musculoskeletal chest wall pain, abdominal pain nausea vomiting and diarrhea, likely a viral illness.  Will test for Covid, give IV fluids and GI cocktail and reassess.Considering the patient's symptoms, medical history, and physical examination today, I have low suspicion for cholecystitis or  biliary pathology, pancreatitis, perforation or bowel obstruction, hernia, intra-abdominal abscess, AAA or dissection, volvulus or intussusception, mesenteric ischemia, or appendicitis.  I doubt ACS PE dissection or carditis.    ----------------------------------------- 2:17 PM on 11/29/2019 -----------------------------------------  Patient feeling better, vitals remained stable.  Tolerating oral intake.  Abdomen benign.  Stable for discharge home with symptomatic management.  Presents with influenza-like illness, suspect this will be a self-limited viral syndrome.      ____________________________________________   FINAL CLINICAL IMPRESSION(S) / ED DIAGNOSES    Final diagnoses:  Acute gastritis without hemorrhage, unspecified gastritis type  Influenza-like illness  Generalized abdominal pain     ED Discharge Orders         Ordered    naproxen (NAPROSYN) 500 MG tablet  2 times daily with meals     11/29/19 1417    ondansetron (ZOFRAN ODT) 4 MG disintegrating tablet  Every 8 hours PRN     11/29/19 1417    famotidine (PEPCID) 20 MG tablet  2 times daily     11/29/19 1417          Portions of this note were generated with dragon dictation software. Dictation errors may occur despite best attempts at proofreading.   Carrie Mew, MD 11/29/19 [phone]

## 2020-01-02 ENCOUNTER — Ambulatory Visit: Payer: BC Managed Care – PPO | Admitting: Physician Assistant

## 2020-01-08 DIAGNOSIS — Z0289 Encounter for other administrative examinations: Secondary | ICD-10-CM

## 2020-01-11 ENCOUNTER — Other Ambulatory Visit: Payer: Self-pay | Admitting: Physician Assistant

## 2020-01-11 NOTE — Telephone Encounter (Signed)
Last apt 11/10, was due back 4 weeks

## 2020-01-12 ENCOUNTER — Other Ambulatory Visit: Payer: Self-pay | Admitting: Physician Assistant

## 2020-02-19 ENCOUNTER — Ambulatory Visit: Payer: BC Managed Care – PPO | Attending: Internal Medicine

## 2020-02-19 DIAGNOSIS — U071 COVID-19: Secondary | ICD-10-CM

## 2020-02-21 LAB — SPECIMEN STATUS REPORT

## 2020-02-21 LAB — NOVEL CORONAVIRUS, NAA: SARS-CoV-2, NAA: NOT DETECTED

## 2020-03-07 ENCOUNTER — Ambulatory Visit
Admission: RE | Admit: 2020-03-07 | Discharge: 2020-03-07 | Disposition: A | Discharge status: Home / Self Care | Payer: BC Managed Care – PPO | Source: Ambulatory Visit | Attending: Internal Medicine | Admitting: Internal Medicine

## 2020-03-07 ENCOUNTER — Other Ambulatory Visit: Payer: Self-pay | Admitting: Internal Medicine

## 2020-03-07 ENCOUNTER — Other Ambulatory Visit: Payer: Self-pay

## 2020-03-07 DIAGNOSIS — F319 Bipolar disorder, unspecified: Secondary | ICD-10-CM

## 2020-03-07 DIAGNOSIS — F411 Generalized anxiety disorder: Secondary | ICD-10-CM | POA: Diagnosis present

## 2020-03-07 DIAGNOSIS — R0789 Other chest pain: Secondary | ICD-10-CM

## 2020-03-07 DIAGNOSIS — Z720 Tobacco use: Secondary | ICD-10-CM | POA: Diagnosis present

## 2020-03-07 MED ORDER — IOHEXOL 300 MG/ML  SOLN
75.0000 mL | Freq: Once | INTRAMUSCULAR | Status: AC | PRN
  Administered 2020-03-07: 75 mL via INTRAVENOUS

## 2020-04-10 ENCOUNTER — Other Ambulatory Visit: Payer: Self-pay | Admitting: Physician Assistant

## 2020-04-10 NOTE — Telephone Encounter (Signed)
Last apt 11/10 due back 4 weeks

## 2020-05-29 ENCOUNTER — Other Ambulatory Visit: Payer: Self-pay | Admitting: Physician Assistant

## 2020-05-29 NOTE — Telephone Encounter (Signed)
Last apt 10/31/2019 due back 4 weeks

## 2020-07-08 ENCOUNTER — Other Ambulatory Visit: Payer: Self-pay | Admitting: Physician Assistant

## 2020-07-08 NOTE — Telephone Encounter (Signed)
Looks like 10/31/2019 was last apt from what I see and was suppose to f/u 4 weeks

## 2021-01-06 ENCOUNTER — Ambulatory Visit: Payer: BC Managed Care – PPO | Admitting: Physician Assistant

## 2021-02-17 ENCOUNTER — Encounter: Payer: Self-pay | Admitting: Physician Assistant

## 2021-02-17 ENCOUNTER — Other Ambulatory Visit: Payer: Self-pay

## 2021-02-17 ENCOUNTER — Ambulatory Visit (INDEPENDENT_AMBULATORY_CARE_PROVIDER_SITE_OTHER): Payer: BC Managed Care – PPO | Admitting: Physician Assistant

## 2021-02-17 DIAGNOSIS — F3162 Bipolar disorder, current episode mixed, moderate: Secondary | ICD-10-CM | POA: Diagnosis not present

## 2021-02-17 DIAGNOSIS — F411 Generalized anxiety disorder: Secondary | ICD-10-CM

## 2021-02-17 DIAGNOSIS — Z87898 Personal history of other specified conditions: Secondary | ICD-10-CM

## 2021-02-17 MED ORDER — CARIPRAZINE HCL 1.5 MG PO CAPS: 1.5000 mg | ORAL_CAPSULE | Freq: Every day | ORAL | 1 refills | Status: DC

## 2021-02-17 NOTE — Progress Notes (Signed)
[facility]  Patient ID: Scott Daniel,  MRN: [id number]  PCP: Tracie Harrier, MD  Date of Evaluation: 02/17/2021 Time spent:40 minutes  Chief Complaint:  Chief Complaint    Anxiety; Depression      HISTORY/CURRENT STATUS: HPI Not doing well.   LOV 10/31/2019. Went off all meds.  See review of systems.  Having marital problems.  He wants to get back on meds to help mental health.  Does not want to lose everything he has, most importantly his relationships.  Has depression and mild manic symptoms.  Depression is the worst part though.  He is able to go to work but does not have a lot of energy.  He enjoys his grandson that is 49 months old now but does not enjoy much else.  Does not cry easily.  Not isolating.  Appetite is normal although he did lose weight when he was using crack.  Not having much anxiety at all.  Denies suicidal or homicidal thoughts.  He has more energy sometimes than others but he is sleeping 7 or 8 hours and feels rested when he gets up.  Other than using drugs which he quit almost 2 months ago now he has had no risky or impulsive behaviors.  No increased libido or spending.  No grandiosity.  No paranoia or hallucinations.  Individual Medical History/ Review of Systems: Changes? :Yes he went off all psych meds probably 10/2019. Started smoking crack April 2021 but quit 12/21/2020.  Past medications for mental health diagnoses include: Chantix, Wellbutrin, Trileptal, Nicoderm Patches,Buspar, Depakote possibly caused pancreatitis,Latuda caused dry mouth, Abilify maybe, Seroquel, Trazodone, Naltrexone, Lamictal, other meds that are unknown.he has had ECT.  Allergies: Chantix [varenicline tartrate] and Depakote [divalproex sodium]  Current Medications:  Current Outpatient Medications:  .  cariprazine (VRAYLAR) capsule, Take 1 capsule (1.5 mg total) by mouth daily., Disp: 30 capsule, Rfl: 1 .  ALPRAZolam (XANAX) 0.5 MG tablet, Take 0.5-1 tablets  (0.25-0.5 mg total) by mouth 3 (three) times daily as needed for anxiety. (Patient not taking: Reported on 02/17/2021), Disp: 90 tablet, Rfl: 1 .  famotidine (PEPCID) 20 MG tablet, Take 1 tablet (20 mg total) by mouth 2 (two) times daily. (Patient not taking: Reported on 02/17/2021), Disp: 60 tablet, Rfl: 0 .  lurasidone (LATUDA) 20 MG TABS tablet, TAKE 1 TABLET BY MOUTH EVERY DAY WITH SUPPER (Patient not taking: Reported on 02/17/2021), Disp: 90 tablet, Rfl: 0 .  naproxen (NAPROSYN) 500 MG tablet, Take 1 tablet (500 mg total) by mouth 2 (two) times daily with a meal. (Patient not taking: Reported on 02/17/2021), Disp: 20 tablet, Rfl: 0 .  ondansetron (ZOFRAN ODT) 4 MG disintegrating tablet, Take 1 tablet (4 mg total) by mouth every 8 (eight) hours as needed for nausea or vomiting. 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  MENTAL HEALTH EXAM:  There were no vitals taken for this visit.There is no height or weight on file to calculate BMI.  General Appearance: Casual, Neat and Well Groomed  Eye Contact:  Good  Speech:  Clear and Coherent and Normal Rate  Volume:  Normal  Mood:  Anxious  Affect:  Anxious  Thought Process:  Goal Directed and Descriptions of Associations: Intact  Orientation:  Full (Time, Place, and Person)  Thought Content: Logical   Suicidal Thoughts:  No  Homicidal Thoughts:  No  Memory:  WNL  Judgement:  Fair  Insight:  Good  Psychomotor Activity:  Normal  Concentration:  Concentration: Good  Recall:  Good  Fund of Knowledge: Good  Language: Good  Assets:  Desire for Improvement  ADL's:  Intact  Cognition: WNL  Prognosis:  Good    DIAGNOSES:    ICD-10-CM   1. Bipolar 1 disorder, mixed, moderate ([facility])  F31.62   2. History of crack cocaine use  Z87.898   3. Generalized anxiety disorder  F41.1     Receiving Psychotherapy: No     RECOMMENDATIONS:  PDMP reviewed. I provided 40 minutes of face-to-face time during this encounter, in which we discussed diagnosis and treatment options.  Recommend restarting an antipsychotic.  Depakote is out of the question because he had pancreatitis while on it.  It is too bad because that was very effective for mania.  Recommend Vraylar or Rexulti because it can help with both mania and depression.  Vraylar would be my first choice.  Benefits, risk and side effects were discussed and he accepts and would like to try it. Discussed drug use, he does not feel it necessary to go to rehab or NA. Start Vraylar 1.5 mg, 1 p.o. daily.  He prefers to keep at a low dose until next visit, but knows we can increase if needed prior to that time.  2 weeks of samples were given as well as a co-pay card. Return in 6 to 8 weeks.  [name], PA-C

## 2021-03-04 ENCOUNTER — Telehealth: Payer: Self-pay | Admitting: Physician Assistant

## 2021-03-04 NOTE — Telephone Encounter (Signed)
The Vraylar needs more time to work.  The symptoms he is experiencing are from the bipolar mania.  He needs to give it more time.

## 2021-03-04 NOTE — Telephone Encounter (Signed)
Pt has been informed,he understands.

## 2021-03-04 NOTE — Telephone Encounter (Signed)
Scott Daniel called to report that the Scott Daniel is not helping that in fact it is making him more anxious and manic.  He would like to go back to the [place], but not at 120mg  maybe just half that. appt 04/18/21.  [facility] [employer] [address] in [place], [place]

## 2021-03-11 ENCOUNTER — Other Ambulatory Visit: Payer: Self-pay | Admitting: Physician Assistant

## 2021-03-12 ENCOUNTER — Other Ambulatory Visit: Payer: Self-pay

## 2021-03-12 ENCOUNTER — Ambulatory Visit (INDEPENDENT_AMBULATORY_CARE_PROVIDER_SITE_OTHER): Payer: BC Managed Care – PPO | Admitting: Physician Assistant

## 2021-03-12 ENCOUNTER — Encounter: Payer: Self-pay | Admitting: Physician Assistant

## 2021-03-12 DIAGNOSIS — F3113 Bipolar disorder, current episode manic without psychotic features, severe: Secondary | ICD-10-CM | POA: Diagnosis not present

## 2021-03-12 DIAGNOSIS — Z87898 Personal history of other specified conditions: Secondary | ICD-10-CM | POA: Diagnosis not present

## 2021-03-12 DIAGNOSIS — Z79899 Other long term (current) drug therapy: Secondary | ICD-10-CM | POA: Diagnosis not present

## 2021-03-12 DIAGNOSIS — F411 Generalized anxiety disorder: Secondary | ICD-10-CM

## 2021-03-12 DIAGNOSIS — F17200 Nicotine dependence, unspecified, uncomplicated: Secondary | ICD-10-CM

## 2021-03-12 MED ORDER — CARBAMAZEPINE ER 200 MG PO TB12: 200.0000 mg | ORAL_TABLET | Freq: Two times a day (BID) | ORAL | 1 refills | Status: DC
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
## 2021-03-14 NOTE — Telephone Encounter (Signed)
On pt's med list but didn't see in the note?

## 2021-03-14 NOTE — Telephone Encounter (Signed)
Sorry, I haven't finished my note yet. Deny this dose, he is on 120 mg but taking 1/2 and has enough I think from old prescription.  Thanks

## 2021-03-16 ENCOUNTER — Other Ambulatory Visit: Payer: Self-pay | Admitting: Physician Assistant

## 2021-03-17 NOTE — Telephone Encounter (Signed)
Next apt 4/19 Last refill 10/2019

## 2021-03-27 ENCOUNTER — Other Ambulatory Visit
Admission: RE | Admit: 2021-03-27 | Discharge: 2021-03-27 | Disposition: A | Discharge status: Home / Self Care | Payer: BC Managed Care – PPO | Source: Ambulatory Visit | Attending: Internal Medicine | Admitting: Internal Medicine

## 2021-03-27 ENCOUNTER — Other Ambulatory Visit: Payer: Self-pay

## 2021-03-27 DIAGNOSIS — Z01812 Encounter for preprocedural laboratory examination: Secondary | ICD-10-CM | POA: Insufficient documentation

## 2021-03-27 DIAGNOSIS — Z20822 Contact with and (suspected) exposure to covid-19: Secondary | ICD-10-CM | POA: Diagnosis not present

## 2021-03-28 ENCOUNTER — Encounter: Payer: Self-pay | Admitting: Internal Medicine

## 2021-03-28 LAB — SARS CORONAVIRUS 2 (TAT 6-24 HRS): SARS Coronavirus 2: NEGATIVE

## 2021-03-31 ENCOUNTER — Ambulatory Visit: Payer: BC Managed Care – PPO | Admitting: Anesthesiology

## 2021-03-31 ENCOUNTER — Ambulatory Visit
Admission: RE | Admit: 2021-03-31 | Discharge: 2021-03-31 | Disposition: A | Discharge status: Home / Self Care | Payer: BC Managed Care – PPO | Attending: Internal Medicine | Admitting: Internal Medicine

## 2021-03-31 ENCOUNTER — Telehealth: Payer: Self-pay | Admitting: Physician Assistant

## 2021-03-31 ENCOUNTER — Encounter: Payer: Self-pay | Admitting: Internal Medicine

## 2021-03-31 ENCOUNTER — Other Ambulatory Visit: Payer: Self-pay

## 2021-03-31 ENCOUNTER — Encounter
Admission: RE | Disposition: A | Discharge status: Home / Self Care | Payer: Self-pay | Source: Home / Self Care | Attending: Internal Medicine

## 2021-03-31 DIAGNOSIS — K2289 Other specified disease of esophagus: Secondary | ICD-10-CM | POA: Insufficient documentation

## 2021-03-31 DIAGNOSIS — K222 Esophageal obstruction: Secondary | ICD-10-CM | POA: Diagnosis not present

## 2021-03-31 DIAGNOSIS — R1314 Dysphagia, pharyngoesophageal phase: Secondary | ICD-10-CM | POA: Diagnosis present

## 2021-03-31 DIAGNOSIS — Z79899 Other long term (current) drug therapy: Secondary | ICD-10-CM | POA: Diagnosis not present

## 2021-03-31 DIAGNOSIS — K2100 Gastro-esophageal reflux disease with esophagitis, without bleeding: Secondary | ICD-10-CM | POA: Diagnosis not present

## 2021-03-31 DIAGNOSIS — K3189 Other diseases of stomach and duodenum: Secondary | ICD-10-CM | POA: Diagnosis not present

## 2021-03-31 DIAGNOSIS — K2970 Gastritis, unspecified, without bleeding: Secondary | ICD-10-CM | POA: Insufficient documentation

## 2021-03-31 DIAGNOSIS — Z85828 Personal history of other malignant neoplasm of skin: Secondary | ICD-10-CM | POA: Insufficient documentation

## 2021-03-31 DIAGNOSIS — Z87891 Personal history of nicotine dependence: Secondary | ICD-10-CM | POA: Diagnosis not present

## 2021-03-31 HISTORY — DX: Anxiety disorder, unspecified: F41.9

## 2021-03-31 HISTORY — DX: Gastro-esophageal reflux disease without esophagitis: K21.9

## 2021-03-31 HISTORY — PX: ESOPHAGOGASTRODUODENOSCOPY (EGD) WITH PROPOFOL: SHX5813

## 2021-03-31 LAB — URINE DRUG SCREEN, QUALITATIVE (ARMC ONLY)
Amphetamines, Ur Screen: NOT DETECTED
Barbiturates, Ur Screen: NOT DETECTED
Benzodiazepine, Ur Scrn: POSITIVE — AB
Cannabinoid 50 Ng, Ur ~~LOC~~: POSITIVE — AB
Cocaine Metabolite,Ur ~~LOC~~: NOT DETECTED
MDMA (Ecstasy)Ur Screen: NOT DETECTED
Methadone Scn, Ur: NOT DETECTED
Opiate, Ur Screen: NOT DETECTED
Phencyclidine (PCP) Ur S: NOT DETECTED
Tricyclic, Ur Screen: NOT DETECTED

## 2021-03-31 SURGERY — ESOPHAGOGASTRODUODENOSCOPY (EGD) WITH PROPOFOL
Anesthesia: General

## 2021-03-31 MED ORDER — SODIUM CHLORIDE 0.9 % IV SOLN: INTRAVENOUS | Status: DC

## 2021-03-31 MED ORDER — SODIUM CHLORIDE 0.9 % IV SOLN: INTRAVENOUS | Status: DC | PRN

## 2021-03-31 MED ORDER — PROPOFOL 10 MG/ML IV BOLUS
INTRAVENOUS | Status: DC | PRN
  Administered 2021-03-31: 70 mg via INTRAVENOUS
  Administered 2021-03-31: 60 mg via INTRAVENOUS

## 2021-03-31 MED ORDER — PROPOFOL 500 MG/50ML IV EMUL
INTRAVENOUS | Status: DC | PRN
  Administered 2021-03-31: 200 ug/kg/min via INTRAVENOUS

## 2021-03-31 NOTE — H&P (Signed)
Outpatient short stay form Pre-procedure 03/31/2021 1:47 PM Draiden Mirsky K. Alice Reichert, M.D.  Primary Physician: Tracie Harrier, M.D.  Reason for visit: Esophageal dysphagia, GERD.  History of present illness: 55 y/o male c/o esophageal dysphagia to most meats. Has long standing GERD symptoms not previously treated with PPI. Currently taking Pantoprazole. Previous GI hx:  CSY: 03/26/2020 - sigmoid diverticulosis, 5 mm hyperplastic polyp in rectum, decreased sphincter tone on exam - EGD: 03/26/2020 - normal esophagus, H pylori negative gastritis, normal examined duodenum       Current Facility-Administered Medications:  .  0.9 %  sodium chloride infusion, , Intravenous, Continuous, Graton, Benay Pike, MD, Last Rate: 20 mL/hr at 03/31/21 1323, New Bag at 03/31/21 1323  Medications Prior to Admission  Medication Sig Dispense Refill Last Dose  . ALPRAZolam (XANAX) 0.5 MG tablet TAKE 0.5-1 TABLETS BY MOUTH 3 TIMES DAILY AS NEEDED FOR ANXIETY. 90 tablet 0 03/30/2021 at Unknown time  . carbamazepine (TEGRETOL XR) 200 MG 12 hr tablet Take 1 tablet (200 mg total) by mouth 2 (two) times daily. 60 tablet 1 03/30/2021 at Unknown time  . cariprazine (VRAYLAR) capsule Take by mouth.   03/30/2021 at Unknown time  . Lurasidone HCl 120 MG TABS Take 60 mg by mouth.   03/30/2021 at Unknown time  . famotidine (PEPCID) 20 MG tablet Take 1 tablet (20 mg total) by mouth 2 (two) times daily. 60 tablet 0 03/29/2021  . naproxen (NAPROSYN) 500 MG tablet Take 1 tablet (500 mg total) by mouth 2 (two) times daily with a meal. (Patient not taking: No sig reported) 20 tablet 0   . ondansetron (ZOFRAN ODT) 4 MG disintegrating tablet Take 1 tablet (4 mg total) by mouth every 8 (eight) hours as needed for nausea or vomiting. (Patient not taking: No sig reported) 20 tablet 0   . pantoprazole (PROTONIX) 40 MG tablet Take 40 mg by mouth daily. (Patient not taking: Reported on 03/31/2021)   Not Taking at Unknown time     Allergies   Allergen Reactions  . Chantix [Varenicline Tartrate] Other (See Comments)    SI and increased depression  . Depakote [Divalproex Sodium]      Past Medical History:  Diagnosis Date  . Anxiety   . Arthritis    "qwhere" (09/02/2016)  . Bipolar 1 disorder ([place])   . Chronic lower back pain   . GERD (gastroesophageal reflux disease)   . Pancreatitis 09/02/2016  . Pre-diabetes   . Skin cancer    "behind my left ear"    Review of systems:  Otherwise negative.    Physical Exam  Gen: Alert, oriented. Appears stated age.  HEENT: [place]/AT. PERRLA. Lungs: CTA, no wheezes. CV: RR nl S1, S2. Abd: soft, benign, no masses. BS+ Ext: No edema. Pulses 2+    Planned procedures: Proceed with EGD. The patient understands the nature of the planned procedure, indications, risks, alternatives and potential complications including but not limited to bleeding, infection, perforation, damage to internal organs and possible oversedation/side effects from anesthesia. The patient agrees and gives consent to proceed.  Please refer to procedure notes for findings, recommendations and patient disposition/instructions.     Glyn Gerads K. Alice Reichert, M.D. Gastroenterology 03/31/2021  1:47 PM

## 2021-03-31 NOTE — Progress Notes (Signed)
No risk

## 2021-03-31 NOTE — Anesthesia Postprocedure Evaluation (Signed)
Anesthesia Post Note  Patient: Scott Daniel  Procedure(s) Performed: ESOPHAGOGASTRODUODENOSCOPY (EGD) WITH PROPOFOL (N/A )  Patient location during evaluation: Endoscopy Anesthesia Type: General Level of consciousness: awake and alert Pain management: pain level controlled Vital Signs Assessment: post-procedure vital signs reviewed and stable Respiratory status: spontaneous breathing and respiratory function stable Cardiovascular status: stable Anesthetic complications: no   No complications documented.   Last Vitals:  Vitals:   03/31/21 1446 03/31/21 1506  BP: (!) 144/93 (!) 157/95  Pulse:    Resp:    Temp:    SpO2:      Last Pain:  Vitals:   03/31/21 1506  TempSrc:   PainSc: 0-No pain                 Melaya Hoselton K

## 2021-03-31 NOTE — OR Nursing (Signed)
UDS sent to lab via volunteer.

## 2021-03-31 NOTE — Op Note (Signed)
[facility] Patient Name: Scott Daniel Procedure Date: 03/31/2021 2:25 PM MRN: [id number] Account #: [id number] Date of Birth: 1966/04/05 Admit Type: Outpatient Age: 55 Room: [facility] ENDO ROOM 2 Gender: Male Note Status: Finalized Procedure:             Upper GI endoscopy Indications:           Esophageal dysphagia, Esophageal reflux Providers:             Benay Pike. Alice Reichert MD, MD Referring MD:          Tracie Harrier, MD (Referring MD) Medicines:             Propofol per Anesthesia Complications:         No immediate complications. Procedure:             Pre-Anesthesia Assessment:                        - The risks and benefits of the procedure and the                         sedation options and risks were discussed with the                         patient. All questions were answered and informed                         consent was obtained.                        - Patient identification and proposed procedure were                         verified prior to the procedure by the nurse. The                         procedure was verified in the procedure room.                        - ASA Grade Assessment: III - A patient with severe                         systemic disease.                        - After reviewing the risks and benefits, the patient                         was deemed in satisfactory condition to undergo the                         procedure.                        After obtaining informed consent, the endoscope was                         passed under direct vision. Throughout the procedure,                         the patient's blood pressure,  pulse, and oxygen                         saturations were monitored continuously. The Endoscope                         was introduced through the mouth, and advanced to the                         third part of duodenum. The upper GI endoscopy was                         accomplished  without difficulty. The patient tolerated                         the procedure well. Findings:      LA Grade C (one or more mucosal breaks continuous between tops of 2 or       more mucosal folds, less than 75% circumference) esophagitis with no       bleeding was found in the distal esophagus.      Diffuse mild mucosal changes characterized by vertical lines and white       specks were found in the upper third of the esophagus and in the lower       third of the esophagus. Biopsies were obtained from the proximal and       distal esophagus with cold forceps for histology of suspected       eosinophilic esophagitis.      One benign-appearing, intrinsic mild stenosis was found at the       gastroesophageal junction. This stenosis measured 1.4 cm (inner       diameter) x less than one cm (in length). The stenosis was traversed.       The scope was withdrawn. Dilation was performed with a Maloney dilator       with no resistance at 21 Fr.      Patchy mild inflammation characterized by erosions and erythema was       found in the gastric antrum. Biopsies were taken with a cold forceps for       Helicobacter pylori testing.      The cardia and gastric fundus were normal on retroflexion.      Diffuse mildly erythematous mucosa without active bleeding and with no       stigmata of bleeding was found in the first portion of the duodenum and       in the second portion of the duodenum.      The exam was otherwise without abnormality. Impression:            - LA Grade C reflux esophagitis with no bleeding.                        - Vertically lined, white specked mucosa in the                         esophagus. Biopsied.                        - Benign-appearing esophageal stenosis. Dilated.                        -  Gastritis. Biopsied.                        - Erythematous duodenopathy.                        - The examination was otherwise normal. Recommendation:        - Patient has a contact  number available for                         emergencies. The signs and symptoms of potential                         delayed complications were discussed with the patient.                         Return to normal activities tomorrow. Written                         discharge instructions were provided to the patient.                        - Resume previous diet.                        - Continue present medications.                        - Await pathology results.                        - Return to GI office in 2 months.                        - Follow up with Octavia Bruckner, PA-C in [ ]  months.                        - The findings and recommendations were discussed with                         the patient. Procedure Code(s):     --- Professional ---                        [phone], Esophagogastroduodenoscopy, flexible,                         transoral; with biopsy, single or multiple                        [id number], Dilation of esophagus, by unguided sound or                         bougie, single or multiple passes Diagnosis Code(s):     --- Professional ---                        R13.14, Dysphagia, pharyngoesophageal phase                        K31.89, Other diseases of stomach and duodenum  K29.70, Gastritis, unspecified, without bleeding                        K22.2, Esophageal obstruction                        K22.8, Other specified diseases of esophagus                        K21.00, Gastro-esophageal reflux disease with                         esophagitis, without bleeding CPT copyright 2019 American Medical Association. All rights reserved. The codes documented in this report are preliminary and upon coder review may  be revised to meet current compliance requirements. Efrain Sella MD, MD 03/31/2021 2:37:16 PM This report has been signed electronically. Number of Addenda: 0 Note Initiated On: 03/31/2021 2:25 PM Estimated Blood Loss:  Estimated blood  loss: none.      [facility]

## 2021-03-31 NOTE — Interval H&P Note (Signed)
History and Physical Interval Note:  03/31/2021 1:48 PM  Scott Daniel  has presented today for surgery, with the diagnosis of DYSPHAGIA.  The various methods of treatment have been discussed with the patient and family. After consideration of risks, benefits and other options for treatment, the patient has consented to  Procedure(s): ESOPHAGOGASTRODUODENOSCOPY (EGD) WITH PROPOFOL (N/A) as a surgical intervention.  The patient's history has been reviewed, patient examined, no change in status, stable for surgery.  I have reviewed the patient's chart and labs.  Questions were answered to the patient's satisfaction.     [place], [place]

## 2021-03-31 NOTE — Anesthesia Preprocedure Evaluation (Signed)
Anesthesia Evaluation  Patient identified by MRN, date of birth, ID band Patient awake    Reviewed: Allergy & Precautions, NPO status , Patient's Chart, lab work & pertinent test results  History of Anesthesia Complications Negative for: history of anesthetic complications  Airway Mallampati: II  TM Distance: >3 FB Neck ROM: Full    Dental no notable dental hx. (+) Teeth Intact   Pulmonary neg sleep apnea, neg COPD, Current Smoker and Patient abstained from smoking., former smoker,  Vapes   Pulmonary exam normal breath sounds clear to auscultation       Cardiovascular Exercise Tolerance: Good METS(-) hypertension(-) CAD and (-) Past MI negative cardio ROS  (-) dysrhythmias  Rhythm:Regular Rate:Normal - Systolic murmurs    Neuro/Psych PSYCHIATRIC DISORDERS Anxiety Bipolar Disorder Hx multiple ECT treatments without any anesthetic complicationsnegative neurological ROS     GI/Hepatic GERD  ,(+)     substance abuse  alcohol use and marijuana use,   Endo/Other  neg diabetes  Renal/GU negative Renal ROS     Musculoskeletal   Abdominal   Peds  Hematology   Anesthesia Other Findings Past Medical History: No date: Anxiety No date: Arthritis     Comment:  "qwhere" (09/02/2016) No date: Bipolar 1 disorder ([facility]) No date: Chronic lower back pain No date: GERD (gastroesophageal reflux disease) 09/02/2016: Pancreatitis No date: Pre-diabetes No date: Skin cancer     Comment:  "behind my left ear"  Reproductive/Obstetrics                             Anesthesia Physical Anesthesia Plan  ASA: II  Anesthesia Plan: General   Post-op Pain Management:    Induction: Intravenous  PONV Risk Score and Plan: 1 and Ondansetron, Propofol infusion and TIVA  Airway Management Planned: Nasal Cannula  Additional Equipment: None  Intra-op Plan:   Post-operative Plan:   Informed Consent: I have  reviewed the patients History and Physical, chart, labs and discussed the procedure including the risks, benefits and alternatives for the proposed anesthesia with the patient or authorized representative who has indicated his/her understanding and acceptance.     Dental advisory given  Plan Discussed with: CRNA and Surgeon  Anesthesia Plan Comments: (Discussed risks of anesthesia with patient, including possibility of difficulty with spontaneous ventilation under anesthesia necessitating airway intervention, PONV, and rare risks such as cardiac or respiratory or neurological events. Patient understands.)        Anesthesia Quick Evaluation

## 2021-03-31 NOTE — Transfer of Care (Signed)
Immediate Anesthesia Transfer of Care Note  Patient: Scott Daniel  Procedure(s) Performed: ESOPHAGOGASTRODUODENOSCOPY (EGD) WITH PROPOFOL (N/A )  Patient Location: PACU and Endoscopy Unit  Anesthesia Type:General  Level of Consciousness: drowsy  Airway & Oxygen Therapy: Patient Spontanous Breathing  Post-op Assessment: Report given to RN  Post vital signs: Reviewed and stable  Last Vitals:  Vitals Value Taken Time  BP    Temp    Pulse 82 03/31/21 1436  Resp 25 03/31/21 1436  SpO2 94 % 03/31/21 1436  Vitals shown include unvalidated device data.  Last Pain:  Vitals:   03/31/21 1310  TempSrc: Tympanic  PainSc: 0-No pain         Complications: No complications documented.

## 2021-03-31 NOTE — Telephone Encounter (Incomplete)
Received after hours call from patient's wife, stating "he is mean, controlling, screaming, he stated he may harm himself.And don't want police involved"  I called wife
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
## 2021-04-01 ENCOUNTER — Encounter: Payer: Self-pay | Admitting: Internal Medicine

## 2021-04-01 NOTE — Telephone Encounter (Signed)
Thank you for the update!

## 2021-04-04 LAB — SURGICAL PATHOLOGY

## 2021-04-18 ENCOUNTER — Ambulatory Visit: Payer: BC Managed Care – PPO | Admitting: Physician Assistant

## 2021-05-02 ENCOUNTER — Telehealth: Payer: Self-pay | Admitting: Physician Assistant

## 2021-05-02 NOTE — Telephone Encounter (Signed)
Scott Daniel called to request a change of providers.  He apparently doesn't think he is getting the care he needs.  His wife has noted that he doesn't seem to be getting any better.  He would like to see a MD so is requesting to be seen by Dr. Clovis Pu.  If he can't be switched to Dr. Clovis Pu he would like a referral to another MD outside our practice because he really feels he needs to see an MD vs a NP or PA

## 2021-05-02 NOTE — Telephone Encounter (Signed)
I cannot take anymore patients on.  There is a new psychiatrist in town Dr. Daron Offer and he probably has available spots or Dr. Arlyn Leak.  Otherwise he can try [facility].

## 2021-05-05 NOTE — Telephone Encounter (Signed)
Please put a note in chart not to make another appt with me, if he should ever want to again. Not dismissing from practice, but just not to see me.

## 2021-05-07 ENCOUNTER — Other Ambulatory Visit: Payer: Self-pay | Admitting: Physician Assistant

## 2021-05-10 ENCOUNTER — Other Ambulatory Visit: Payer: Self-pay | Admitting: Physician Assistant

## 2021-08-04 ENCOUNTER — Ambulatory Visit (INDEPENDENT_AMBULATORY_CARE_PROVIDER_SITE_OTHER): Payer: BC Managed Care – PPO | Admitting: Psychiatry

## 2021-08-04 ENCOUNTER — Other Ambulatory Visit: Payer: Self-pay

## 2021-08-04 DIAGNOSIS — F319 Bipolar disorder, unspecified: Secondary | ICD-10-CM

## 2021-08-04 DIAGNOSIS — F1210 Cannabis abuse, uncomplicated: Secondary | ICD-10-CM

## 2021-08-04 DIAGNOSIS — F411 Generalized anxiety disorder: Secondary | ICD-10-CM | POA: Diagnosis not present

## 2021-08-04 DIAGNOSIS — F1021 Alcohol dependence, in remission: Secondary | ICD-10-CM

## 2021-08-04 DIAGNOSIS — Z87898 Personal history of other specified conditions: Secondary | ICD-10-CM

## 2021-08-04 NOTE — Progress Notes (Signed)
PROBLEM-FOCUSED INITIAL PSYCHOTHERAPY EVALUATION Scott Moore, PhD LP [facility]  Name: Scott Daniel Date: 08/04/2021 Time spent: 50 min MRN: [id number] DOB: June 09, 1966 Guardian/Payee: self  PCP: Tracie Harrier, MD Documentation requested on this visit: No  PROBLEM HISTORY Reason for Visit /Presenting Problem:  Chief Complaint  Patient presents with   Establish Care   Anxiety   Addiction Problem    Narrative/History of Present Illness Referred by self for treatment of obsessive anxiety and anger toward wife, and to re-establish control over cyclic mood and substance abuse.  Hx Bipolar Disorder.  Used to see Ms. Hurst for meds, stopped in May.  Has actually been off mood stabilizer for a year.  Can't get it out of his head that wife is cheating on him.  Daily obsession with whether she's in it or not.  She had an admitted verbal affair with a man earlier but credibly swears not acted on physically.  Former boyfriend from some time back, back in touch through social media.  First of March looked at [employer] phone, saw a bunch of text with a man from her college past, sexting, taking about divorce.  Almost suicided by cop while drunk and angry.  Domestic dispute, police called, both he and wife were jailed, then both cases were thrown out.  Denies any history of serious suicide attempt otherwise.  History of him in 2008, moved out.  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  Church split soon enough over the pastor cheating, too.  Has been out of organized church ever since the downfall.  History of substance abuse including marijuana, crack, and alcohol.  Last alcohol was Sunday (yesterday) -- two beers.  Typically drinks when golfing.  Plays on Thursdays, with buddies working at [employer].  Working with them since April.  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   Erlene Quan for half a year.    Hx trauma includes 53yo, B suicided in front of him.    Sleeping 4-5 hours a night.  Typically in bed 11:30-midnight, up 5:30am.  Weekends will sleep 7-8 hrs, catch up some.  Work every other weekend.  Caffeine from colas 2-3 per morning, Powerade & water through afternoon, water and/or tea evenings.  Prior Psychiatric Assessment/Treatment:   Outpatient treatment: only med management noted Psychiatric hospitalization: none stated Psychological assessment/testing: none stated   Abuse/neglect screening: Victim of abuse: Not assessed at this time / none suspected.   Victim of neglect: Not assessed at this time / none suspected.   Perpetrator of abuse/neglect: not assessed.   Witness / Exposure to Domestic Violence:  not assessed; witnessed suicide .   Witness to Community Violence:  Not assessed at this time / none suspected.   Protective Services Involvement: No.   Report needed: No.    Substance abuse screening: Current substance abuse: Yes.  Last drink yesterday, does not anticipate withdrawal sxs.  Some pot use of late but means to abstain there, too. History of impactful substance use/abuse: Yes.     FAMILY/SOCIAL HISTORY Family of origin -- B suicide, as above Family of intention/current living situation -- with wife [name].  Family includes two adult children, son 67, daughter 40.   Education -- deferred Vocation -- various, as above Finances -- deferred Spiritually -- out of church, raised [employer], moved to [employer] with wife Enjoyable activities -- deferred Other situational factors affecting  treatment and prognosis: Stressors from the following areas: Marital or family conflict, destabilized medication Barriers to service: [place] availability, lack of stabilized meds  Notable cultural sensitivities: deferred Strengths: deferred   MED/SURG HISTORY Med/surg history was not reviewed with PT at this time.   Past Medical History:  Diagnosis Date    Anxiety    Arthritis    "qwhere" (09/02/2016)   Bipolar 1 disorder ([place])    Chronic lower back pain    GERD (gastroesophageal reflux disease)    Pancreatitis 09/02/2016   Pre-diabetes    Skin cancer    "behind my left ear"     Past Surgical History:  Procedure Laterality Date   COLON SURGERY     ESOPHAGOGASTRODUODENOSCOPY (EGD) WITH PROPOFOL N/A 03/31/2021   Procedure: ESOPHAGOGASTRODUODENOSCOPY (EGD) WITH PROPOFOL;  Surgeon: Toledo, Benay Pike, MD;  Location: [facility] ENDOSCOPY;  Service: Gastroenterology;  Laterality: N/A;   JOINT REPLACEMENT  2007   right total hip replacement   SKIN CANCER EXCISION Left    "behind my ear"   TOTAL HIP ARTHROPLASTY Right ~ 2008    Allergies  Allergen Reactions   Chantix [Varenicline Tartrate] Other (See Comments)    SI and increased depression   Depakote [Divalproex Sodium]     Medications (as listed in Epic): Current Outpatient Medications  Medication Sig Dispense Refill   carbamazepine (TEGRETOL XR) 200 MG 12 hr tablet Take 1 tablet (200 mg total) by mouth 2 (two) times daily. 60 tablet 1   famotidine (PEPCID) 20 MG tablet Take 1 tablet (20 mg total) by mouth 2 (two) times daily. 60 tablet 0   No current facility-administered medications for this visit.    MENTAL STATUS AND OBSERVATIONS Appearance:   Casual     Behavior:  Mildly agitated  Motor:  Restlestness  Speech/Language:   Clear and Coherent  Affect:  Appropriate  Mood:  anxious  Thought process:  Mild POS  Thought content:    Obsessions  Sensory/Perceptual disturbances:    WNL  Orientation:  Fully oriented  Attention:  Good  Concentration:  Fair  Memory:  WNL  Fund of knowledge:   Fair  Insight:    Fair  Judgment:   Fair  Impulse Control:  Fair   Initial Risk Assessment: Danger to self: No Self-injurious behavior: No Danger to others: No Physical aggression / violence: No Duty to warn: No Access to firearms a concern: No Gang involvement: No Patient / guardian  was educated about steps to take if suicide or homicide risk level increases between visits: yes While future psychiatric events cannot be accurately predicted, the patient does not currently require acute inpatient psychiatric care and does not currently meet [facility] involuntary commitment criteria.   DIAGNOSIS:    ICD-10-CM   1. Bipolar I disorder ([place])  F31.9     2. Generalized anxiety disorder  F41.1     3. Alcohol use disorder, moderate, in early remission ([place])  F10.21     4. Cannabis abuse  F12.10     5. History of crack cocaine use  Z87.898       INITIAL TREATMENT: Support/validation provided for distressing symptoms and confirmed rapport Ethical orientation and informed consent confirmed re: privacy rights -- including but not limited to HIPAA, EMR and use of e-PHI patient responsibilities -- scheduling, fair notice of changes, in-person vs. telehealth and regulatory and financial conditions affecting choice expectations for working relationship in psychotherapy needs and consents for working partnerships and exchange of information with other  health care providers, especially any medication and other behavioral health providers Initial orientation to cognitive-behavioral and solution-focused therapy approach Psychoeducation and initial recommendations: Affirmed need to reestablish mod stabilizer Affirmed need to get completely off alcohol and any drugs of abuse Should back up last caffeine to help protect bedtime and sleep Interpreted obsession with wife as largely powered by his own guilt for past infidelity, even if she did in fact start dabbling with an old flame online.  Also powered by guilt over allowing his mood disorder to run more freely in recent months.  Priority has to be on cleaning up his own behavior, but he can ask [name] to abstain from communicating with the man for his sake. Made clear that it is not necessarily possible to get him back in quickly or  frequently -- intensive program may be required if he deteriorates Outlook for therapy -- scheduling constraints, availability of crisis service, inclusion of family member(s) as appropriate  Plan: Endorse asking [name] to hold off contact with her old flame, but don't try to make her, and don't hound her with questions -- that is the kind of behavior that will teach her to go away. Already restarting mood stabilizer -- obtain guidance how to do that, what dose to start Abstain from alcohol and all drugs of abuse.  AA recommended for support. Establish caffeine curfew of 6-8 hrs before bed Not clear whether [name] will renew service, having released him for lying to her about med compliance and then creating dramatic scenario in March.  May loon into it, may apologize to her as he sees fit, but be willing to accept if not open. Maintain medication as prescribed and work faithfully with relevant prescriber(s) if any changes are desired or seem indicated Consider intensive program if unable to maintain stability Call the clinic on-call service, present to ER, or call 911 if any life-threatening psychiatric crisis Return 2-4 wks as able, for time as available, needs reschedule with prescriber.  Blanchie Serve, PhD  Scott Moore, PhD LP Clinical Psychologist, [facility] [address] [place], [place] [address] [phone]

## 2021-09-05 ENCOUNTER — Emergency Department
Admission: EM | Admit: 2021-09-05 | Discharge: 2021-09-06 | Disposition: A | Discharge status: Home / Self Care | Payer: BC Managed Care – PPO | Attending: Emergency Medicine | Admitting: Emergency Medicine

## 2021-09-05 ENCOUNTER — Ambulatory Visit: Payer: BC Managed Care – PPO | Admitting: Psychiatry

## 2021-09-05 ENCOUNTER — Other Ambulatory Visit: Payer: Self-pay

## 2021-09-05 ENCOUNTER — Encounter: Payer: Self-pay | Admitting: Behavioral Health

## 2021-09-05 DIAGNOSIS — Z85828 Personal history of other malignant neoplasm of skin: Secondary | ICD-10-CM | POA: Insufficient documentation

## 2021-09-05 DIAGNOSIS — F411 Generalized anxiety disorder: Secondary | ICD-10-CM | POA: Diagnosis not present

## 2021-09-05 DIAGNOSIS — Y905 Blood alcohol level of 100-119 mg/100 ml: Secondary | ICD-10-CM | POA: Insufficient documentation

## 2021-09-05 DIAGNOSIS — F1210 Cannabis abuse, uncomplicated: Secondary | ICD-10-CM | POA: Diagnosis not present

## 2021-09-05 DIAGNOSIS — F1094 Alcohol use, unspecified with alcohol-induced mood disorder: Secondary | ICD-10-CM | POA: Insufficient documentation

## 2021-09-05 DIAGNOSIS — F1021 Alcohol dependence, in remission: Secondary | ICD-10-CM | POA: Diagnosis not present

## 2021-09-05 DIAGNOSIS — R45851 Suicidal ideations: Secondary | ICD-10-CM | POA: Diagnosis not present

## 2021-09-05 DIAGNOSIS — Z96641 Presence of right artificial hip joint: Secondary | ICD-10-CM | POA: Insufficient documentation

## 2021-09-05 DIAGNOSIS — R4689 Other symptoms and signs involving appearance and behavior: Secondary | ICD-10-CM

## 2021-09-05 DIAGNOSIS — Z046 Encounter for general psychiatric examination, requested by authority: Secondary | ICD-10-CM | POA: Diagnosis present

## 2021-09-05 DIAGNOSIS — F319 Bipolar disorder, unspecified: Secondary | ICD-10-CM | POA: Diagnosis not present

## 2021-09-05 DIAGNOSIS — Z87898 Personal history of other specified conditions: Secondary | ICD-10-CM

## 2021-09-05 DIAGNOSIS — Z87891 Personal history of nicotine dependence: Secondary | ICD-10-CM | POA: Diagnosis not present

## 2021-09-05 LAB — COMPREHENSIVE METABOLIC PANEL
ALT: 20 U/L (ref 0–44)
AST: 16 U/L (ref 15–41)
Albumin: 4.5 g/dL (ref 3.5–5.0)
Alkaline Phosphatase: 57 U/L (ref 38–126)
Anion gap: 10 (ref 5–15)
BUN: 14 mg/dL (ref 6–20)
CO2: 22 mmol/L (ref 22–32)
Calcium: 9.3 mg/dL (ref 8.9–10.3)
Chloride: 106 mmol/L (ref 98–111)
Creatinine, Ser: 0.83 mg/dL (ref 0.61–1.24)
GFR, Estimated: >60 mL/min (ref >60)
Glucose, Bld: 99 mg/dL (ref 70–99)
Potassium: 3.3 mmol/L — ABNORMAL LOW (ref 3.5–5.1)
Sodium: 138 mmol/L (ref 135–145)
Total Bilirubin: 0.7 mg/dL (ref 0.3–1.2)
Total Protein: 7.3 g/dL (ref 6.5–8.1)

## 2021-09-05 LAB — CBC
HCT: 46.7 % (ref 39.0–52.0)
Hemoglobin: 16.6 g/dL (ref 13.0–17.0)
MCH: 32.7 pg (ref 26.0–34.0)
MCHC: 35.5 g/dL (ref 30.0–36.0)
MCV: 92.1 fL (ref 80.0–100.0)
Platelets: 62 10*3/uL — ABNORMAL LOW (ref 150–400)
RBC: 5.07 MIL/uL (ref 4.22–5.81)
RDW: 12.9 % (ref 11.5–15.5)
WBC: 7 10*3/uL (ref 4.0–10.5)
nRBC: 0 % (ref 0.0–0.2)

## 2021-09-05 LAB — ETHANOL: Alcohol, Ethyl (B): 114 mg/dL — ABNORMAL HIGH (ref <10)

## 2021-09-05 LAB — SALICYLATE LEVEL: Salicylate Lvl: <7 mg/dL — ABNORMAL LOW (ref 7.0–30.0)

## 2021-09-05 LAB — ACETAMINOPHEN LEVEL: Acetaminophen (Tylenol), Serum: <10 ug/mL — ABNORMAL LOW (ref 10–30)

## 2021-09-05 NOTE — ED Triage Notes (Signed)
Pt states has been consuming etoh. Per son pt he received a phone call from his sister who stated pt was attempting to harm himself. Pt currently denies SI or hi. Per family pt was was possibly attempting to stab himself with a sharp object.

## 2021-09-05 NOTE — Progress Notes (Addendum)
Psychotherapy Progress Note [facility] Scott Moore, PhD LP  Patient ID: Scott Daniel     MRN: [id number] Therapy format: Individual psychotherapy Date: 09/05/2021      Start: 3:12p     Stop: 4:00p     Time Spent: 48 min Location: In-person   Session narrative (presenting needs, interim history, self-report of stressors and symptoms, applications of prior therapy, status changes, and interventions made in session) After first sessions, went home and let Scott Daniel know he understands why she got in touch with old flame.  Seemed to help settle things.  Tried to refrain from checking/asking her, but new provocation that the man in question Scott Daniel, former sex partner late teens) has tried again to be back in touch, and sent her a friend request.  Led to a confrontation Sunday.  Says she doesn't understand why she can't stay in touch with a friend.  Meanwhile, beach trip two weekends ago ([place]), got really drunk, on liquor, and supposedly stepped over a 3rd story rail and was going to jump in a hotel pool.  No memory of that, but nieces and nephews were shocked.  Laid off alcohol all week and yesterday paid golf first time in ages w/o drinking.  Counts that a success at abstinence.  Affirmed and encouraged.  Started back medication right after the "flip out" [place].  Back on Latuda 120 Q 9pm regularly and BID carbamazepine for anxiety.  Knows he will need renewals, and wants to restart with Scott Daniel and apologize for history of lying to her about how he was doing.  No guarantee he can do that, but welcome to try, will offer through internal message that he wants to.  Probed personal supports -- golf buddy, stepF Scott Daniel", recovering alcoholic), counts both of them people he can reach out to.  Regards sF heroic for putting up with his mother (undiagnosed, suspected Bipolar).  As not contacted AA but used to host a meeting, he says, back in the day.  Encouraged that everybody can come back to  the fellowship, and the only requirement is a sincere desire to quit.  Meanwhile, seeking a [place] marriage counselor.  Wife is seeking therapy for herself through [employer].  If they cannot find a marriage counselor soon, willing to sit in as temporary.  Therapeutic modalities: Cognitive Behavioral Therapy, Solution-Oriented/Positive Psychology, 12-Step, and Motivational Interviewing  Mental Status/Observations:  Appearance:   Casual     Behavior:  Appropriate  Motor:  Restlestness  Speech/Language:   Clear and Coherent  Affect:  Appropriate and somewhat restless  Mood:  anxious  Thought process:  normal  Thought content:    Obsessions  Sensory/Perceptual disturbances:    WNL  Orientation:  Fully oriented  Attention:  Good    Concentration:  Fair  Memory:  WNL  Insight:    Fair  Judgment:   Fair  Impulse Control:  Fair   Risk Assessment: Danger to Self: No - denies any SI at present Self-injurious Behavior: No Danger to Others: No Physical Aggression / Violence: No Duty to Warn: No Access to Firearms a concern: No  Assessment of progress:  stabilized  Diagnosis:   ICD-10-CM   1. Bipolar I disorder ([place])  F31.9     2. Generalized anxiety disorder  F41.1     3. Alcohol use disorder, moderate, in early remission ([place])  F10.21     4. Cannabis abuse  F12.10     5. 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  If not Ms. Scott Daniel, will recommend Scott Chris, NP for personality match and working  relationship. Other recommendations/advice as may be noted above Continue to utilize previously learned skills ad lib Maintain medication as prescribed and work faithfully with relevant prescriber(s) if any changes are desired or seem indicated Call the clinic on-call service, 988/hotline, present to ER, or call 911 if any life-threatening psychiatric crisis Return for time as available. Already scheduled visit in this office Visit date not found.  Addendum: Met with Scott Chris, NP on 9/23 to confer.  Reports he fielded a distress call (see note) that included heavy substance abuse and threatening suicide.  911/EMS involved, involuntary commitment discussed if wife saw fit to follow through.  EHR notes ER assessment at [facility] 9/17, presenting no danger to self or others when seen, only alcohol elevation found, and released on recognizance back to outpatient resources.  Discrepancies in the two reports include 6 Xanax and 5 beers reported to Scott Daniel, 3 Xanax and 3-4 beers reported to ER.  ER report also notes constructive conversation with wife after therapy having devolved into threats.  Narrative collected there includes statement from wife that he stated, 30 minutes after getting home from therapy, "Well, I'm just going to go ahead and end it" and he slammed 5 beers, took Xanax, and started looking for tools to run through his chest.  Placed under IVC as of Friday evening 9/16, released from observation midday Saturday 9/17.  Concur with Scott Daniel that PT should be considered still a high risk for noncompliance and compliance with mood-stabilizing medication and abstinence from alcohol and all drugs of abuse is paramount importance.  If he can remain honest and abide by treatment recommendations, he may be able to restabilize and recover at the outpatient level.  It is clear that Ms. Scott Daniel will not be continuing service with him, and Scott Daniel is willing to accept transfer on condition of zero  tolerance for lying about treatment.  Concur also that he should be informed that we recommend intensive dual diagnosis treatment, such as [facility] as preferred treatment at this time.  If he fails working outpatient contract, he will be dismissed with that recommendation.  Scott Moore, PhD LP Clinical Psychologist, [facility] [address] [place], [place] [address] [phone]

## 2021-09-05 NOTE — ED Notes (Addendum)
Pt dressed out into hospital provided attire with this tech and [place], EDT in the rm. Pt belongings consist of a grey shirt, black tennis shoes black shorts, a grey rubber ring, grey socks, 3 $1 bills, a pair of nail clippers, a red lighter, a black wallet, 2 nickels, a blue belt, and grey underwear. Pt calm and cooperative while dressing out. Pt belongings placed into one pt belongings bag and labeled with pt name.

## 2021-09-05 NOTE — ED Provider Notes (Signed)
[facility] Emergency Department Provider Note ____________________________________________   Event Date/Time   First MD Initiated Contact with Patient 09/05/21 2148     (approximate)  I have reviewed the triage vital signs and the nursing notes.  HISTORY  Chief Complaint Psychiatric Evaluation   HPI Scott Daniel is a 55 y.o. malewho presents to the ED for evaluation of his mental health  Chart review indicates history of bipolar disorder.   Patient is brought to the ED by family for evaluation of his mental health in the setting of his threatening to harm himself. By the time that I saw the patient, he is denying suicidality.  He is clearly intoxicated.  He reports getting into a verbal altercation with his wife this evening.  He reports the relationship has been unsteady recently and accuses her of cheating on him.  He reports seeing his therapist today, going home with some conversational tools to try to help the relationship, but it quickly devolving into threats.  He reports that he would never kill himself, because his brother killed himself and patient was 4 years old.  Patient reports that he would never do that to his mother.  He admits to taking 3 Xanax's and drinking 3-4 beers today.  Additional history obtained over the phone from patient's wife, as indicated below.  Which is little more concerning and patient was threatening to harm himself in multiple fashions in order to kill himself.  Furthermore, there is a note placed this evening by psychiatric NP, [name], documenting a conversation with patient's wife to a similar effect that he was threatening to kill himself.  Past Medical History:  Diagnosis Date   Anxiety    Arthritis    "qwhere" (09/02/2016)   Bipolar 1 disorder ([place])    Chronic lower back pain    GERD (gastroesophageal reflux disease)    Pancreatitis 09/02/2016   Pre-diabetes    Skin cancer    "behind my left ear"     Patient Active Problem List   Diagnosis Date Noted   History of crack cocaine use 02/17/2021   Bipolar 1 disorder, mixed, moderate ([place]) 02/17/2021   Mild mixed bipolar I disorder ([place]) 10/23/2018   Generalized anxiety disorder 10/23/2018   Pancreatitis 09/02/2016   Medication overdose 09/02/2016   Bipolar 1 disorder ([place]) 09/02/2016   Elevated LFTs 09/02/2016    Past Surgical History:  Procedure Laterality Date   COLON SURGERY     ESOPHAGOGASTRODUODENOSCOPY (EGD) WITH PROPOFOL N/A 03/31/2021   Procedure: ESOPHAGOGASTRODUODENOSCOPY (EGD) WITH PROPOFOL;  Surgeon: Toledo, Benay Pike, MD;  Location: [facility] ENDOSCOPY;  Service: Gastroenterology;  Laterality: N/A;   JOINT REPLACEMENT  2007   right total hip replacement   SKIN CANCER EXCISION Left    "behind my ear"   TOTAL HIP ARTHROPLASTY Right ~ 2008    Prior to Admission medications   Medication Sig Start Date End Date Taking? Authorizing Provider  ALPRAZolam (XANAX) 0.5 MG tablet TAKE 0.5-1 TABLETS BY MOUTH 3 TIMES DAILY AS NEEDED FOR ANXIETY. 03/17/21   Addison Lank, PA-C  carbamazepine (TEGRETOL XR) 200 MG 12 hr tablet Take 1 tablet (200 mg total) by mouth 2 (two) times daily. 03/12/21   [name] T, PA-C  cariprazine (VRAYLAR) capsule Take by mouth.    [provider]  famotidine (PEPCID) 20 MG tablet Take 1 tablet (20 mg total) by mouth 2 (two) times daily. 11/29/19   Carrie Mew, MD  Lurasidone HCl 120 MG TABS  Take 60 mg by mouth.    [provider]  naproxen (NAPROSYN) 500 MG tablet Take 1 tablet (500 mg total) by mouth 2 (two) times daily with a meal. Patient not taking: No sig reported 11/29/19   Carrie Mew, MD  ondansetron (ZOFRAN ODT) 4 MG disintegrating tablet Take 1 tablet (4 mg total) by mouth every 8 (eight) hours as needed for nausea or vomiting. Patient not taking: No sig reported 11/29/19   Carrie Mew, MD  pantoprazole (PROTONIX) 40 MG tablet Take 40 mg by mouth  daily. Patient not taking: Reported on 03/31/2021    [provider]    Allergies Chantix [varenicline tartrate] and Depakote [divalproex sodium]  No family history on file.  Social History Social History   Tobacco Use   Smoking status: Former    Packs/day: 1.00    Years: 32.00    Pack years: 32.00    Types: Cigarettes   Smokeless tobacco: Former    Quit date: 02/09/2019   Tobacco comments:    quit 12/21/2020  Substance Use Topics   Alcohol use: Yes    Alcohol/week: 4.0 standard drinks    Types: 4 Cans of beer per week   Drug use: Yes    Types: Marijuana, "Crack" cocaine    Comment: only pot now, a few times per week.    Review of Systems  Constitutional: No fever/chills Eyes: No visual changes. ENT: No sore throat. Cardiovascular: Denies chest pain. Respiratory: Denies shortness of breath. Gastrointestinal: No abdominal pain.  No nausea, no vomiting.  No diarrhea.  No constipation. Genitourinary: Negative for dysuria. Musculoskeletal: Negative for back pain. Skin: Negative for rash. Neurological: Negative for headaches, focal weakness or numbness.  ____________________________________________   PHYSICAL EXAM:  VITAL SIGNS: Vitals:   09/05/21 2132  BP: (!) 141/95  Pulse: 92  Resp: 16  Temp: 99 F (37.2 C)  SpO2: 100%      Constitutional: Alert and oriented.  Slurring his speech and clearly clinically intoxicated. Eyes: Conjunctivae are normal. PERRL. EOMI. Head: Atraumatic. Nose: No congestion/rhinnorhea. Mouth/Throat: Mucous membranes are moist.  Oropharynx non-erythematous. Neck: No stridor. No cervical spine tenderness to palpation. Cardiovascular: Normal rate, regular rhythm. Grossly normal heart sounds.  Good peripheral circulation. Respiratory: Normal respiratory effort.  No retractions. Lungs CTAB. Gastrointestinal: Soft , nondistended, nontender to palpation. No CVA tenderness. Musculoskeletal: No lower extremity tenderness nor edema.   No joint effusions. No signs of acute trauma. Neurologic:  Normal speech and language. No gross focal neurologic deficits are appreciated.  Skin:  Skin is warm, dry and intact. No rash noted. Psychiatric: Mood and affect are normal. Speech and behavior are normal. ____________________________________________   LABS (all labs ordered are listed, but only abnormal results are displayed)  Labs Reviewed  COMPREHENSIVE METABOLIC PANEL - Abnormal; Notable for the following components:      Result Value   Potassium 3.3 (*)    All other components within normal limits  CBC - Abnormal; Notable for the following components:   Platelets 62 (*)    All other components within normal limits  ETHANOL  SALICYLATE LEVEL  ACETAMINOPHEN LEVEL  URINE DRUG SCREEN, QUALITATIVE ([facility] ONLY)   ____________________________________________  12 Lead EKG   ____________________________________________  RADIOLOGY  ED MD interpretation:    Official radiology report(s): No results found.  ____________________________________________   PROCEDURES and INTERVENTIONS  Procedure(s) performed (including Critical Care):  Procedures  Medications - No data to display  ____________________________________________   MDM / ED COURSE  Intoxicated 55 year old male presents to the ED for evaluation of his mental health, due to outpatient threats to kill himself, requiring IVC to get him to sober up and for psychiatric evaluation.  Normal vitals and examination demonstrates clinical intoxication.  No signs of trauma, neurologic or vascular deficits.  No evidence of medical pathology to preclude psychiatric evaluation and disposition.  Clinical Course as of 09/05/21 2230  Fri Sep 05, 2021  2148 I call wife, Scott Daniel, he came in from therapy, calm, but within 30 minutes he walked back into the house and said "well i'm just going to go ahead and end it." Threatened to take too many xanaxes. Threatened to stab  himself. Had "slammed 5 beers." Caused his mania to explode. Took tools out of his toolbox, threatening to run tools through his chest to kill himself.  [DS]  2227 The patient has been placed in psychiatric observation due to the need to provide a safe environment for the patient while obtaining psychiatric consultation and evaluation, as well as ongoing medical and medication management to treat the patient's condition.  The patient has been placed under full IVC at this time.   [DS]    Clinical Course User Index [DS] Vladimir Crofts, MD    ____________________________________________   FINAL CLINICAL IMPRESSION(S) / ED DIAGNOSES  Final diagnoses:  Suicidal ideation  Aggressive behavior     ED Discharge Orders     None        Ellis Mehaffey Tamala Julian   Note:  This document was prepared using Dragon voice recognition software and may include unintentional dictation errors.    Vladimir Crofts, MD 09/05/21 2232

## 2021-09-05 NOTE — ED Notes (Signed)
MD Tamala Julian in triage conversing with pt.

## 2021-09-05 NOTE — Progress Notes (Addendum)
Received call from after hours emergency at 8:07 PM. Received information that [name], wife of patient Scott Daniel that he was suicidal and had taken approximately 6 xanax with 5 beers and stated that he was going to kill himself.  I was able to reach [name] via telephone at 8:15 pm and she told me the same circumstances. She was fearful that the pt was going to follow through and the family also did not feel safe.  She said that 911 was utilized and that [facility] had arrived along with [facility]. She advised me that [facility] stated that they spoke with Pt and said they could not transport him to ER because Pt was refusing to go. I asked to speak with Deputy and advised them that this was a medical emergency and overdose due to xanax and ETOH.  I advised  and highly recommended that they transport him to [facility] for medical clearance and psychological evaluation. He did not advise they would transport. I then spoke to [name] and told her that if he was not transported to leave a family member in the home to watch patient and that she could go to magistrate in [place] to seek IVC. I advised that these were the only two options available at this time unless they could convince him to go and drive him to ER.  I further advised for them to follow up with [name], PA on Monday morning after 9:00 am. I did advise if she further felt threatened or in danger in anyway to call 911 again if necessary.

## 2021-09-05 NOTE — BH Assessment (Signed)
This writer attempted to assess patient with Psyc NP but patient was unable to be aroused due to intoxication. Will attempt at a later time.

## 2021-09-06 DIAGNOSIS — F1094 Alcohol use, unspecified with alcohol-induced mood disorder: Secondary | ICD-10-CM | POA: Diagnosis present

## 2021-09-06 NOTE — ED Provider Notes (Signed)
-----------------------------------------   11:32 AM on 09/06/2021 ----------------------------------------- Patient has been seen and evaluated by psychiatry, they believe the patient is safe for discharge home with outpatient resources that were provided to the patient.  Medical work-up is largely nonrevealing besides initial alcohol level elevation as well as slightly low platelet count.  We will have the patient follow-up with his doctor to recheck platelet count in 1 to 2 weeks..  Patient will be discharged with PCP follow-up.   Harvest Dark, MD 09/06/21 1133

## 2021-09-06 NOTE — Discharge Instructions (Addendum)
You have been seen in the emergency department for a  psychiatric concern. You have been evaluated both medically as well as psychiatrically. Please follow-up with your outpatient resources provided. Return to the emergency department for any worsening symptoms, or any thoughts of hurting yourself or anyone else so that we may attempt to help you.  Your work-up today in the emergency department does show a low platelet count.  Please follow-up with your primary care doctor in 1 to 2 weeks for recheck of your platelet count.  Return to the emergency department for any bleeding such as black or bloody stool, or any other symptom personally concerning to yourself.
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
## 2021-09-06 NOTE — BH Assessment (Signed)
This writer attempted to assess patient with TTS but patient was unable to be aroused due to intoxication. Will attempt at a later time.

## 2021-09-11 ENCOUNTER — Other Ambulatory Visit: Payer: Self-pay | Admitting: Physician Assistant

## 2021-09-12 NOTE — Telephone Encounter (Signed)
Should pt be taking this?note on 9/16 shows he tried to overdose

## 2021-09-12 NOTE — Telephone Encounter (Signed)
You're right, I'll deny it. Thanks.

## 2021-10-17 ENCOUNTER — Other Ambulatory Visit: Payer: Self-pay

## 2021-10-17 ENCOUNTER — Ambulatory Visit (INDEPENDENT_AMBULATORY_CARE_PROVIDER_SITE_OTHER): Payer: BC Managed Care – PPO | Admitting: Psychiatry

## 2021-10-17 DIAGNOSIS — R69 Illness, unspecified: Secondary | ICD-10-CM

## 2021-10-17 DIAGNOSIS — F1210 Cannabis abuse, uncomplicated: Secondary | ICD-10-CM | POA: Diagnosis not present

## 2021-10-17 DIAGNOSIS — F1021 Alcohol dependence, in remission: Secondary | ICD-10-CM | POA: Diagnosis not present

## 2021-10-17 DIAGNOSIS — F411 Generalized anxiety disorder: Secondary | ICD-10-CM

## 2021-10-17 DIAGNOSIS — F319 Bipolar disorder, unspecified: Secondary | ICD-10-CM | POA: Diagnosis not present

## 2021-10-17 DIAGNOSIS — Z87898 Personal history of other specified conditions: Secondary | ICD-10-CM

## 2021-10-17 NOTE — Progress Notes (Signed)
Psychotherapy Progress Note [facility] [name], PhD LP  Patient ID: Scott Daniel [facility])    MRN: [id number] Therapy format: Individual psychotherapy Date: 10/17/2021      Start: 3:25p     Stop: 4:05p     Time Spent: 40 min Location: In-person   Session narrative (presenting needs, interim history, self-report of stressors and symptoms, applications of prior therapy, status changes, and interventions made in session) Rough time lately.  6 weeks since seen, but credibly says he has been completely off alcohol, after being hospitalized for a drunken, suicidal rant.  Is off Xanax now, as well.  Admittedly marijuana 2-3 per week.  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  Alcohol remains available in the house b/c [name] wants to still be free to drink herself.  Cautioned and encouraged to ask her to remove it if he needs it to be out for the sake of his own control.  Tried going to an NA meeting but it wasn't meeting.  Supports have been stepfather and just going to church.  Relying also on buddy from work (who still drinks but understands).  Declined a [place] trip that would have involved drinking buddies.    Managing to keep from laying any jealousy on [name] at this point.  Says he just never thinks about it now, trust is back.  Encouraged to notice anything [name] does that helps, stick with trusting mindset, remember that acting in working trust helps her trust him and that his doubts have probably most come from his own guilty history, amped up by manic and substance-abusing states of mind.    Medication wise, is on Latuda only, 60mg  at lunchtime, as he decided it did him some good an broke it back out.  No longer on Lamictal, attrib to dizziness, feeling of anxiety/hot flash which did stop sharply when he went off Lamictal.  Supply is leftovers from the past year of prescriptions, and he basically has 180  days of 120mg  strength, and only taking half that has a years' supply.  Discussed appropriate decision-making about med management, encouraged he reschedule with the new psychiatrist, cancelled amid crisis.  Briefed on how to get off on the right foot, informed (to his surprise) that Ms. Hurst released him for dishonesty and Mr. White is willing provided they start honest and stay honest.  New revelation for treatment, urged by his wife, to acknowledge a problem with porn.  Briefly reviewed its function for him, denies any urges to act it out with other women, acknowledges it has basically served as an excitement in the midst of feeling depressed and leave-able.  Framed as a "fruits of the spirit" issue, that it can be relatively harmless if just a tool to function, but even if it doesn't lead him to stray again, he may decide it just matters as an irritant to the wife he says he wants to stay close.  Hadn't though of that, makes sense maybe to cut back as a gesture of respect for her feelings.  Also reports he opened his own bank account a month ago.  Tired of asking permission to spend  Therapeutic modalities: Cognitive Behavioral Therapy, Solution-Oriented/Positive Psychology, Ego-Supportive, and Psycho-education/Bibliotherapy  Mental Status/Observations:  Appearance:   Casual     Behavior:  Appropriate and slightly agitated  Motor:  Normal  Speech/Language:   Clear and Coherent  Affect:  Appropriate  Mood:  euthymic  Thought process:  normal  Thought content:    WNL  Sensory/Perceptual disturbances:    WNL  Orientation:  Fully oriented  Attention:  Good    Concentration:  Fair  Memory:  WNL  Insight:    Fair  Judgment:   Good  Impulse Control:  Fair   Risk Assessment: Danger to Self: No Self-injurious Behavior: No Danger to Others: No Physical Aggression / Violence: No Duty to Warn: No Access to Firearms a concern: No  Assessment of progress:  progressing  Diagnosis:    ICD-10-CM   1. Bipolar I disorder ([place])  F31.9     2. Generalized anxiety disorder  F41.1     3. Alcohol use disorder, moderate, in early remission ([place])  F10.21     4. Cannabis abuse  F12.10     5. History of crack cocaine use  Z87.898     6. r/o porn addiction  R69      Plan:  Maintain absolute alcohol abstinence.  Ask wife if needed to remove supply from the house.  Continue to recommend AA/NA. Recommend marijuana abstinence -- continuing potential to cause instability of mood, motivation, and emotional perception, as well as signal risk of relapse in dramatic and alienating behavior  Absolute honesty about substance use, with both TX and RX.  Reschedule RX appt.  Option offer apology to former psychiatrist -- attempted on the way out but she was occupied; may write if so moved. Recommend asking [name], openly, how his porn habit matters to her -- may be clarifying, good exercise in sensitive communication, gesture of trustworthiness to be sincerely interested Further assess decision to establish independent credit card, monitor for mania Other recommendations/advice as may be noted above Continue to utilize previously learned skills ad lib Maintain medication as prescribed and work faithfully with relevant prescriber(s) if any changes are desired or seem indicated Call the clinic on-call service, 988/hotline, present to ER, or call 911 if any life-threatening psychiatric crisis Return for as available, need RS with [name]. Already scheduled visit in this office Visit date not found.  Blanchie Serve, PhD [name], PhD LP Clinical Psychologist, [facility] [address], [place], [place] [address] [phone]

## 2021-10-30 ENCOUNTER — Ambulatory Visit: Payer: BC Managed Care – PPO | Admitting: Behavioral Health

## 2021-11-04 ENCOUNTER — Encounter: Payer: Self-pay | Admitting: Behavioral Health

## 2021-11-04 ENCOUNTER — Other Ambulatory Visit: Payer: Self-pay

## 2021-11-04 ENCOUNTER — Ambulatory Visit: Payer: BC Managed Care – PPO | Admitting: Behavioral Health

## 2021-11-04 DIAGNOSIS — F411 Generalized anxiety disorder: Secondary | ICD-10-CM | POA: Diagnosis not present

## 2021-11-04 DIAGNOSIS — Z79899 Other long term (current) drug therapy: Secondary | ICD-10-CM

## 2021-11-04 DIAGNOSIS — F319 Bipolar disorder, unspecified: Secondary | ICD-10-CM | POA: Diagnosis not present

## 2021-11-04 DIAGNOSIS — Z87898 Personal history of other specified conditions: Secondary | ICD-10-CM

## 2021-11-04 DIAGNOSIS — F1021 Alcohol dependence, in remission: Secondary | ICD-10-CM

## 2021-11-04 DIAGNOSIS — F17200 Nicotine dependence, unspecified, uncomplicated: Secondary | ICD-10-CM

## 2021-11-04 DIAGNOSIS — F1210 Cannabis abuse, uncomplicated: Secondary | ICD-10-CM

## 2021-11-04 MED ORDER — LURASIDONE HCL 80 MG PO TABS: 80.0000 mg | ORAL_TABLET | Freq: Every day | ORAL | 1 refills | Status: DC

## 2021-11-04 NOTE — Progress Notes (Signed)
[facility]  Patient ID: Scott Daniel,  MRN: [id number]  PCP: Tracie Harrier, MD  Date of Evaluation: 11/04/2021 Time spent:40 minutes  Chief Complaint:  Chief Complaint   Anxiety; Depression; Medication Refill; Medication Problem; Manic Behavior; Alcohol Problem; Family Problem     HISTORY/CURRENT STATUS: HPI  55 year old male presents to this office for follow up and medication management. He is prior patient of [name] and also currently receiving therapy from  Dr. Blanchie Serve, PhD. He says that he has been doing much better since his presentation to the ER at [facility] on 9/17. Says that he has reduced his ETOH and last drinking was 8 weeks ago. Says he has also cut back on his Cannabis use and only smoke sporadically. He acknowledges that his last episode with threats of suicide were fueled by ETOH and his antipsychotic medication. He is agreeing today to be committed to compliance with medications and recommendations. He says he is appreciated for me agreeing to see him. He says that his anxiety and depression are still very present but his has mild periods of mania every week. Says his depressive episodes are much less. He endorses irritability, lack of concentration and rapid mood swings. He says his anxiety today is 5/10 and depression 2/10. Says he is sleeping about 7 hours per night when not experiencing mania. When he has burst of manic energy, he says that he gets about 5 hours. He agrees to regular follow ups and will submit to drug or alcohol testing as requested.    Past medications for mental health diagnoses include:  Vraylar, Chantix, Wellbutrin, Trileptal, Nicoderm Patches, Buspar, Depakote possibly caused pancreatitis, Latuda caused dry mouth, Abilify maybe, Seroquel, Trazodone, Naltrexone, Lamictal, other meds that are unknown. he has had ECT.   Individual Medical History/ Review of Systems: Changes? :No   Allergies: Chantix  [varenicline tartrate] and Depakote [divalproex sodium]  Current Medications:  Current Outpatient Medications:    lurasidone (LATUDA) 80 MG TABS tablet, Take 1 tablet (80 mg total) by mouth daily with breakfast., Disp: 30 tablet, Rfl: 1   carbamazepine (TEGRETOL XR) 200 MG 12 hr tablet, Take 1 tablet (200 mg total) by mouth 2 (two) times daily., Disp: 60 tablet, Rfl: 1   famotidine (PEPCID) 20 MG tablet, Take 1 tablet (20 mg total) by mouth 2 (two) times daily., Disp: 60 tablet, Rfl: 0 Medication Side Effects: none  Family Medical/ Social History: Changes? No  MENTAL HEALTH EXAM:  There were no vitals taken for this visit.There is no height or weight on file to calculate BMI.  General Appearance: Casual  Eye Contact:  Good  Speech:  Clear and Coherent  Volume:  Normal  Mood:  Anxious and Depressed  Affect:  Appropriate, Depressed, and Anxious  Thought Process:  Coherent  Orientation:  Full (Time, Place, and Person)  Thought Content: Logical   Suicidal Thoughts:  Yes.  without intent/plan  Homicidal Thoughts:  No  Memory:  WNL  Judgement:  Fair  Insight:  Fair  Psychomotor Activity:  Normal  Concentration:  Concentration: Good  Recall:  Good  Fund of Knowledge: Fair  Language: Fair  Assets:  Desire for Improvement Physical Health Resilience  ADL's:  Intact  Cognition: WNL  Prognosis:  Fair    DIAGNOSES:    ICD-10-CM   1. Bipolar I disorder (HCC)  F31.9 lurasidone (LATUDA) 80 MG TABS tablet    2. Generalized anxiety disorder  F41.1 lurasidone (LATUDA) 80 MG TABS tablet  3. Alcohol use disorder, moderate, in early remission ([place])  F10.21     4. Cannabis abuse  F12.10     5. Smoker  F17.200     6. History of crack cocaine use  Z87.898     7. Encounter for long-term (current) use of medications  Z79.899       Receiving Psychotherapy: Yes [name]   RECOMMENDATIONS:    I provided 40 minutes of face-to-face time during this encounter, including time  spent before and after the visit in reviewing records and charting. We discussed his long history of non-compliance with therapies and medications. Reviewed his previous care with [name]. Discussed his ETOH and substance use complicating care further and his previous dishonesty providers about his behaviors. We discussed his not liking to take meds especially when he started to feel bette. I stipulated and reviewed his history with him and reinforced the importance of taking medications exactly as prescribed and requiring honesty in reporting intoxication or excessive drinking as well as any drug use. I advised that if he fails to comply with any of these expectations, I would not be able to provide safe care anymore.  Pt increased his Latuda about 8 weeks ago without consult to 60 mg.  He is no longer taking Tegretol. He will increase Latuda to 80 mg once daily and he may take at bedtime due to fatigue.  Will report any worsening symptoms Will follow up in 4 weeks to reassess Provided emergency contact information Reviewed Bouse, NP  [facility]

## 2021-11-24 ENCOUNTER — Ambulatory Visit: Payer: BC Managed Care – PPO | Admitting: Psychiatry

## 2021-12-01 ENCOUNTER — Ambulatory Visit: Payer: BC Managed Care – PPO | Admitting: Behavioral Health

## 2021-12-08 ENCOUNTER — Encounter: Payer: Self-pay | Admitting: Behavioral Health

## 2021-12-08 ENCOUNTER — Ambulatory Visit (INDEPENDENT_AMBULATORY_CARE_PROVIDER_SITE_OTHER): Payer: BC Managed Care – PPO | Admitting: Behavioral Health

## 2021-12-08 ENCOUNTER — Other Ambulatory Visit: Payer: Self-pay

## 2021-12-08 DIAGNOSIS — F411 Generalized anxiety disorder: Secondary | ICD-10-CM

## 2021-12-08 DIAGNOSIS — F17200 Nicotine dependence, unspecified, uncomplicated: Secondary | ICD-10-CM | POA: Diagnosis not present

## 2021-12-08 DIAGNOSIS — F3162 Bipolar disorder, current episode mixed, moderate: Secondary | ICD-10-CM

## 2021-12-08 DIAGNOSIS — F1021 Alcohol dependence, in remission: Secondary | ICD-10-CM | POA: Diagnosis not present

## 2021-12-08 DIAGNOSIS — F319 Bipolar disorder, unspecified: Secondary | ICD-10-CM

## 2021-12-08 DIAGNOSIS — F1210 Cannabis abuse, uncomplicated: Secondary | ICD-10-CM

## 2021-12-08 MED ORDER — LURASIDONE HCL 80 MG PO TABS: 80.0000 mg | ORAL_TABLET | Freq: Every day | ORAL | 2 refills | Status: AC

## 2021-12-08 NOTE — Progress Notes (Signed)
[facility]  Patient ID: Scott Daniel,  MRN: [id number]  PCP: Tracie Harrier, MD  Date of Evaluation: 12/08/2021 Time spent:30 minutes  Chief Complaint:  Chief Complaint   Depression; Follow-up; Alcohol Problem; Manic Behavior; Medication Refill; Anxiety     HISTORY/CURRENT STATUS: HPI  55 year old male presents to this office for follow up and medication management. He is prior patient of [name] and also currently receiving therapy from  Dr. Blanchie Serve, PhD. He says that he has felt much better since increasing his Lutuda to 80 mg daily. Says he has not experienced any mania since last visit. Says that he has cut back his drinking 50%. Says he is still using cannabis sporadically.He likes his medication and does not want to make any changes this visit.  He says his anxiety today is 3/10 and depression 2/10. Says he is sleeping about 7 hours per night. He agrees to regular follow ups and will submit to drug or alcohol testing as requested.      Past medications for mental health diagnoses include:  Vraylar, Chantix, Wellbutrin, Trileptal, Nicoderm Patches, Buspar, Depakote possibly caused pancreatitis, Latuda caused dry mouth, Abilify maybe, Seroquel, Trazodone, Naltrexone, Lamictal, other meds that are unknown. he has had ECT.       Individual Medical History/ Review of Systems: Changes? :No   Allergies: Chantix [varenicline tartrate] and Depakote [divalproex sodium]  Current Medications:  Current Outpatient Medications:    famotidine (PEPCID) 20 MG tablet, Take 1 tablet (20 mg total) by mouth 2 (two) times daily., Disp: 60 tablet, Rfl: 0   lurasidone (LATUDA) 80 MG TABS tablet, Take 1 tablet (80 mg total) by mouth daily with breakfast., Disp: 30 tablet, Rfl: 2 Medication Side Effects: none  Family Medical/ Social History: Changes? No  MENTAL HEALTH EXAM:  There were no vitals taken for this visit.There is no height or weight on file to  calculate BMI.  General Appearance: Casual and Neat  Eye Contact:  Good  Speech:  Blocked  Volume:  Normal  Mood:  NA  Affect:  Appropriate  Thought Process:  Coherent  Orientation:  Full (Time, Place, and Person)  Thought Content: Logical   Suicidal Thoughts:  No  Homicidal Thoughts:  No  Memory:  WNL  Judgement:  Good  Insight:  Good  Psychomotor Activity:  Normal  Concentration:  Concentration: Good  Recall:  Good  Fund of Knowledge: Good  Language: Good  Assets:  Desire for Improvement  ADL's:  Intact  Cognition: WNL  Prognosis:  Good    DIAGNOSES:    ICD-10-CM   1. Bipolar I disorder (HCC)  F31.9 lurasidone (LATUDA) 80 MG TABS tablet    2. Alcohol use disorder, moderate, in early remission ([place])  F10.21     3. Generalized anxiety disorder  F41.1 lurasidone (LATUDA) 80 MG TABS tablet    4. Smoker  F17.200     5. Cannabis abuse  F12.10     6. Bipolar 1 disorder, mixed, moderate ([place])  F31.62       Receiving Psychotherapy: yes, [name]   RECOMMENDATIONS:    I provided 30 minutes of face-to-face time during this encounter, including time spent before and after the visit in reviewing records and charting. We discussed his self reported significant improvement with anxiety and depression. Says that his irritability has decreased 80%. Pt increased his Latuda about 8 weeks ago without consult to 60 mg.  He will increase Latuda to 80 mg once daily  and he may take at bedtime due to fatigue.  Will report any worsening symptoms Will follow up in 4 weeks to reassess Provided emergency contact information Reviewed [place], NP        [place], NP

## 2022-01-09 ENCOUNTER — Ambulatory Visit: Payer: BC Managed Care – PPO | Admitting: Psychiatry

## 2022-01-09 ENCOUNTER — Encounter: Payer: Self-pay | Admitting: Psychiatry

## 2022-01-09 DIAGNOSIS — F1010 Alcohol abuse, uncomplicated: Secondary | ICD-10-CM

## 2022-01-09 DIAGNOSIS — F1210 Cannabis abuse, uncomplicated: Secondary | ICD-10-CM

## 2022-01-09 DIAGNOSIS — F319 Bipolar disorder, unspecified: Secondary | ICD-10-CM

## 2022-01-09 NOTE — Progress Notes (Deleted)
Psychotherapy Progress Note [facility] [name], PhD LP  Patient ID: Scott Daniel [facility])    MRN: [id number] Therapy format: {Therapy Types:21967::"Individual psychotherapy"} Date: 01/09/2022      Start: ***:***     Stop: ***:***     Time Spent: *** min Location: {SvcLoc:22530::"In-person"}   Session narrative (presenting needs, interim history, self-report of stressors and symptoms, applications of prior therapy, status changes, and interventions made in session) ***  Therapeutic modalities: {AM:23362::"Cognitive Behavioral Therapy","Solution-Oriented/Positive Psychology"}  Mental Status/Observations:  Appearance:   {PSY:22683}     Behavior:  {PSY:21022743}  Motor:  {PSY:22302}  Speech/Language:   {PSY:22685}  Affect:  {PSY:22687}  Mood:  {PSY:31886}  Thought process:  {PSY:31888}  Thought content:    {PSY:202 777 1935}  Sensory/Perceptual disturbances:    {PSY:[phone]}  Orientation:  {Psych Orientation:23301::"Fully oriented"}  Attention:  {Good-Fair-Poor ratings:23770::"Good"}    Concentration:  {Good-Fair-Poor ratings:23770::"Good"}  Memory:  {[phone]}  Insight:    {Good-Fair-Poor ratings:23770::"Good"}  Judgment:   {Good-Fair-Poor ratings:23770::"Good"}  Impulse Control:  {Good-Fair-Poor ratings:23770::"Good"}   Risk Assessment: Danger to Self: {Risk:22599::"No"} Self-injurious Behavior: {Risk:22599::"No"} Danger to Others: {Risk:22599::"No"} Physical Aggression / Violence: {Risk:22599::"No"} Duty to Warn: {AMYesNo:22526::"No"} Access to Firearms a concern: {AMYesNo:22526::"No"}  Assessment of progress:  {Progress:22147::"progressing"}  Diagnosis: No diagnosis found. Plan:  *** Other recommendations/advice as may be noted above Continue to utilize previously learned skills ad lib Maintain medication as prescribed and work faithfully with relevant prescriber(s) if any changes are desired or seem indicated Call the clinic  on-call service, 988/hotline, 911, or present to [facility] or ER if any life-threatening psychiatric crisis No follow-ups on file. Already scheduled visit in this office 01/19/2022.  Blanchie Serve, PhD [name], PhD LP Clinical Psychologist, [facility] [address], [place], [place] [address] [phone]

## 2022-01-09 NOTE — Progress Notes (Signed)
Admin note for no show  Patient ID: Scott Daniel  MRN: [id number] DATE: 01/09/2022  No show for 3pm appt today.  History of unreliable self-report and self-control, intense impulsivity leading to alcohol intoxication, mood instability, intense jealousy, vivid arguments, and suicidal threats/getures at times requiring police repsonse.  Began therapy at a time when he had recently been terminated by prior psychiatrist for lying about his substance abuse and emotional/behavioral status and repeatedly creating crisis. First seen by me 8/15, then 9/16, he has been noted for not planning ahead despite reminders, was last seen 10/28, then cancelled 12/5.  With [name], he cancelled 11/10 and no showed 12/12 before being seen last on 12/19.  In late summer, behavior noted above was under inconsistent control, and recent medication visits document general improvement in emotional control and allegedly more controlled use of alcohol and pot since reestablishing Latuda at strength, but his pattern of attendance for mental health visits is unsteady enough to conside him questionable whether he is actually in service for therapy.  At this point, charge normally.  I will remain available if he I motivated and can organize his own scheduling, but from this point he should not be considered in active service until he makes reasonable effort to schedule and attend.  Other patients need the time.  Blanchie Serve, PhD [name], PhD LP Clinical Psychologist, [facility] [address] [place], [place] [address] [phone]

## 2022-01-19 ENCOUNTER — Telehealth: Payer: Self-pay | Admitting: Behavioral Health

## 2022-01-19 ENCOUNTER — Other Ambulatory Visit: Payer: Self-pay

## 2022-01-19 ENCOUNTER — Encounter: Payer: Self-pay | Admitting: Behavioral Health

## 2022-01-19 ENCOUNTER — Ambulatory Visit (INDEPENDENT_AMBULATORY_CARE_PROVIDER_SITE_OTHER): Payer: BC Managed Care – PPO | Admitting: Behavioral Health

## 2022-01-19 DIAGNOSIS — F1490 Cocaine use, unspecified, uncomplicated: Secondary | ICD-10-CM

## 2022-01-19 DIAGNOSIS — F1010 Alcohol abuse, uncomplicated: Secondary | ICD-10-CM

## 2022-01-19 DIAGNOSIS — F1021 Alcohol dependence, in remission: Secondary | ICD-10-CM | POA: Diagnosis not present

## 2022-01-19 DIAGNOSIS — F411 Generalized anxiety disorder: Secondary | ICD-10-CM

## 2022-01-19 DIAGNOSIS — F319 Bipolar disorder, unspecified: Secondary | ICD-10-CM | POA: Diagnosis not present

## 2022-01-19 DIAGNOSIS — F3162 Bipolar disorder, current episode mixed, moderate: Secondary | ICD-10-CM

## 2022-01-19 NOTE — Telephone Encounter (Signed)
Noted, thank you

## 2022-01-19 NOTE — Progress Notes (Signed)
[facility]  Patient ID: ARISTIDIS TALERICO,  MRN: [id number]  PCP: Tracie Harrier, MD  Date of Evaluation: 01/19/2022 Time spent:30 minutes  Chief Complaint:  Chief Complaint   Depression; Drug Problem; Follow-up; Medication Problem; Addiction Problem; Anxiety; Patient Education     HISTORY/CURRENT STATUS: HPI  56 year old male presents to this office for follow up and medication management. He is prior patient of [name] and also currently receiving therapy from  Dr. Blanchie Serve, PhD. He says that he has recently hit the bottom since last visit and is hooked on crack cocaine. Says tearfully that he needs help. He has not been taking his medications very often since using the substance. He is using about once per day or more.  He is worried he will lose his family. He is not currently under the influence of the substance and is think logically this visit. He says his anxiety today is 9/10 and depression 8/10. Says he is sleeping about 5 hours per night. He is requesting resources to help with his addiction. He denies mania, no current psychosis. No SI/HI. He was provided with emergency contact numbers and advised to call 911 if needed.      Past medications for mental health diagnoses include:  Vraylar, Chantix, Wellbutrin, Trileptal, Nicoderm Patches, Buspar, Depakote possibly caused pancreatitis, Latuda caused dry mouth, Abilify maybe, Seroquel, Trazodone, Naltrexone, Lamictal, other meds that are unknown. he has had ECT.   Individual Medical History/ Review of Systems: Changes? :No   Allergies: Chantix [varenicline tartrate] and Depakote [divalproex sodium]  Current Medications:  Current Outpatient Medications:    famotidine (PEPCID) 20 MG tablet, Take 1 tablet (20 mg total) by mouth 2 (two) times daily., Disp: 60 tablet, Rfl: 0   lurasidone (LATUDA) 80 MG TABS tablet, Take 1 tablet (80 mg total) by mouth daily with breakfast., Disp: 30 tablet, Rfl:  2 Medication Side Effects: none  Family Medical/ Social History: Changes? No  MENTAL HEALTH EXAM:  There were no vitals taken for this visit.There is no height or weight on file to calculate BMI.  General Appearance: Casual  Eye Contact:  Fair  Speech:  Clear and Coherent  Volume:  Normal  Mood:  Anxious and Depressed  Affect:  Congruent, Depressed, Flat, Restricted, and Anxious  Thought Process:  Coherent  Orientation:  Full (Time, Place, and Person)  Thought Content: Logical   Suicidal Thoughts:  No  Homicidal Thoughts:  No  Memory:  WNL  Judgement:  Good  Insight:  Good  Psychomotor Activity:  Normal  Concentration:  Concentration: Good  Recall:  Good  Fund of Knowledge: Good  Language: Good  Assets:  Desire for Improvement  ADL's:  Intact  Cognition: WNL  Prognosis:  Good    DIAGNOSES:    ICD-10-CM   1. Bipolar I disorder ([place])  F31.9     2. Alcohol use disorder, mild, abuse  F10.10     3. Alcohol use disorder, moderate, in early remission ([place])  F10.21     4. Generalized anxiety disorder  F41.1     5. Bipolar 1 disorder, mixed, moderate (HCC)  F31.62     6. Crack cocaine use  F14.90       Receiving Psychotherapy: No    RECOMMENDATIONS:   I provided 30 minutes of face-to-face time during this encounter, including time spent before and after the visit in reviewing records and charting.Patient revealed to me that he is hooked on crack cocaine. He was candid  and said that he had sporadically been taking he medications since being hooked. Says that he has not been drinking excessively or smoking cannabis since the starting with crack. I advised him that I cannot safely and effectively treat him while using this illicit substance. I encouraged him to contact [facility] for supervised rehab. He said that it was "his only hope". I contacted [facility] directly and provided patient with instructions on how to initiate the process in getting admitted. I  explained that the first step  of the process was him realizing he needs intensive therapy and supervision to have a chance on overcoming this lethal drug. He said that he would call this afternoon with insurance information. I instructed him to notify this office when he enters the program or similar one. For emergencies he should call 911 or go to nearest ER.  Will report his progress.  Will follow up in with me after successfully completing a residential tx facility.  Provided emergency contact information      Elwanda [place], NP

## 2022-01-19 NOTE — Telephone Encounter (Signed)
Patient wanted you to know- Scott Daniel made the call and is waiting on info back. Then he will take care of it.

## 2022-03-19 ENCOUNTER — Ambulatory Visit: Payer: BC Managed Care – PPO | Admitting: Behavioral Health

## 2022-03-19 ENCOUNTER — Other Ambulatory Visit: Payer: Self-pay

## 2022-03-19 ENCOUNTER — Telehealth: Payer: Self-pay | Admitting: Behavioral Health

## 2022-03-19 MED ORDER — ARIPIPRAZOLE 5 MG PO TABS: 5.0000 mg | ORAL_TABLET | Freq: Every day | ORAL | 0 refills | Status: DC

## 2022-03-19 NOTE — Telephone Encounter (Signed)
Rx sent 

## 2022-03-19 NOTE — Telephone Encounter (Signed)
Scott Daniel's next visit is 04/06/22. Scott Daniel has completed drug rehab on 03/02/22. He completed 28 days of detox. While in rehab he was given Abilify 5 mg and has 3 days left of it. He is requesting an RX for this called to: ? ?[facility] [place], [place] ? ?Phone:  [phone]  ?Fax:  [phone]  ? ? ? ? ? ?

## 2022-04-06 ENCOUNTER — Ambulatory Visit: Payer: BC Managed Care – PPO | Admitting: Behavioral Health

## 2022-04-13 ENCOUNTER — Other Ambulatory Visit: Payer: Self-pay | Admitting: Behavioral Health

## 2022-04-13 NOTE — Telephone Encounter (Signed)
Aaron Edelman, I know he is dismissed from the practice according to the letter you sent on 03/26/2022 ? ?Are you still going to send in his refill until he has a new provider? ?

## 2022-04-13 NOTE — Telephone Encounter (Signed)
Yes, I will for two months from date of letter.

## 2022-09-02 ENCOUNTER — Other Ambulatory Visit: Payer: Self-pay | Admitting: Behavioral Health

## 2023-03-18 ENCOUNTER — Encounter: Payer: Self-pay | Admitting: Dermatology

## 2023-03-18 ENCOUNTER — Ambulatory Visit: Payer: BC Managed Care – PPO | Admitting: Dermatology

## 2023-03-18 DIAGNOSIS — L578 Other skin changes due to chronic exposure to nonionizing radiation: Secondary | ICD-10-CM | POA: Diagnosis not present

## 2023-03-18 DIAGNOSIS — L821 Other seborrheic keratosis: Secondary | ICD-10-CM

## 2023-03-18 DIAGNOSIS — D234 Other benign neoplasm of skin of scalp and neck: Secondary | ICD-10-CM

## 2023-03-18 DIAGNOSIS — D485 Neoplasm of uncertain behavior of skin: Secondary | ICD-10-CM

## 2023-03-18 NOTE — Progress Notes (Unsigned)
   New Patient Visit   Subjective  Scott Daniel is a 57 y.o. male who presents for the following: Spot of his right scalp that has grown a lot since November The patient has spots, moles and lesions to be evaluated, some may be new or changing and the patient has concerns that these could be cancer.  The following portions of the chart were reviewed this encounter and updated as appropriate: medications, allergies, medical history  Review of Systems:  No other skin or systemic complaints except as noted in HPI or Assessment and Plan.  Objective  Well appearing patient in no apparent distress; mood and affect are within normal limits.  A focused examination was performed of the following areas: Scalp  Relevant exam findings are noted in the Assessment and Plan.  Right scalp Crusted red papule        Assessment & Plan   SEBORRHEIC KERATOSIS - Stuck-on, waxy, tan-brown papules and/or plaques  - Benign-appearing - Discussed benign etiology and prognosis. - Observe - Call for any changes  ACTINIC DAMAGE - chronic, secondary to cumulative UV radiation exposure/sun exposure over time - diffuse scaly erythematous macules with underlying dyspigmentation - Recommend daily broad spectrum sunscreen SPF 30+ to sun-exposed areas, reapply every 2 hours as needed.  - Recommend staying in the shade or wearing long sleeves, sun glasses (UVA+UVB protection) and wide brim hats (4-inch brim around the entire circumference of the hat). - Call for new or changing lesions.  Neoplasm of uncertain behavior of skin Right scalp  Skin excision  Total excision diameter (cm):  1.8 Informed consent: discussed and consent obtained   Timeout: patient name, date of birth, surgical site, and procedure verified   Procedure prep:  Patient was prepped and draped in usual sterile fashion Prep type:  Isopropyl alcohol and povidone-iodine Anesthesia: the lesion was anesthetized in a standard fashion    Anesthetic:  1% lidocaine w/ epinephrine 1-100,000 buffered w/ 8.4% NaHCO3 Instrument used: #15 blade   Hemostasis achieved with: pressure   Hemostasis achieved with comment:  Electrocautery Outcome: patient tolerated procedure well with no complications   Post-procedure details: sterile dressing applied and wound care instructions given   Dressing type: bandage and pressure dressing (mupirocin)    Destruction of lesion Complexity: extensive   Destruction method: electrodesiccation and curettage   Informed consent: discussed and consent obtained   Timeout:  patient name, date of birth, surgical site, and procedure verified Procedure prep:  Patient was prepped and draped in usual sterile fashion Prep type:  Isopropyl alcohol Anesthesia: the lesion was anesthetized in a standard fashion   Anesthetic:  1% lidocaine w/ epinephrine 1-100,000 buffered w/ 8.4% NaHCO3 Curettage performed in three different directions: Yes   Electrodesiccation performed over the curetted area: Yes   Final wound size (cm):  1.8 Hemostasis achieved with:  pressure and aluminum chloride Outcome: patient tolerated procedure well with no complications   Post-procedure details: sterile dressing applied and wound care instructions given   Dressing type: bandage and petrolatum    Specimen 1 - Surgical pathology Differential Diagnosis: Hemangioma vs BCC vs SCC vs other Check Margins: No 2 pieces - smaller is deep margin EDC today  Actinic skin damage  Seborrheic keratosis   Return pending biopsy results.  I, Ashok Cordia, CMA, am acting as scribe for Sarina Ser, MD .  Documentation: I have reviewed the above documentation for accuracy and completeness, and I agree with the above.  Sarina Ser, MD

## 2023-03-18 NOTE — Patient Instructions (Signed)
Shave Excision Benign Lesion Wound Care Instructions  Leave the original bandage on for 24 hours if possible.  If the bandage becomes soaked or soiled before that time, it is OK to remove it and examine the wound.  A small amount of post-operative bleeding is normal.  If excessive bleeding occurs, remove the bandage, place gauze over the site and apply continuous pressure (no peeking) over the area for 20-30 minutes.  If this does not stop the bleeding, try again for 40 minutes.  If this does not work, please call our clinic as soon as possible (even if after-hours).    Twice a day, cleanse the wound with soap and water.  If a thick crust develops you may use a Q-tip dipped into dilute hydrogen peroxide (mix 1:1 with water) to dissolve it.  Hydrogen peroxide can slow the healing process, so use it only as needed.  After washing, apply Vaseline jelly or Polysporin ointment.  For best healing, the wound should be covered with a layer of ointment at all times.  This may mean re-applying the ointment several times a day.  For open wounds, continue until it has healed.    If you have any swelling, keep the area elevated.  Some redness, tenderness and white or yellow material in the wound is normal healing.  If the area becomes very sore and red, or develops a thick yellow-green material (pus), it may be infected; please notify us.    Wound healing continues for up to one year following surgery.  It is not unusual to experience pain in the scar from time to time during the interval.  If the pain becomes severe or the scar thickens, you should notify the office.  A slight amount of redness in a scar is expected for the first six months.  After six months, the redness subsides and the scar will soften and fade.  The color difference becomes less noticeable with time.  If there are any problems, return for a post-op surgery check at your earliest convenience.  Please call our office for any questions or  concerns.  Due to recent changes in healthcare laws, you may see results of your pathology and/or laboratory studies on MyChart before the doctors have had a chance to review them. We understand that in some cases there may be results that are confusing or concerning to you. Please understand that not all results are received at the same time and often the doctors may need to interpret multiple results in order to provide you with the best plan of care or course of treatment. Therefore, we ask that you please give us 2 business days to thoroughly review all your results before contacting the office for clarification. Should we see a critical lab result, you will be contacted sooner.   If You Need Anything After Your Visit  If you have any questions or concerns for your doctor, please call our main line at [phone] and press option 4 to reach your doctor's medical assistant. If no one answers, please leave a voicemail as directed and we will return your call as soon as possible. Messages left after 4 pm will be answered the following business day.   You may also send us a message via MyChart. We typically respond to MyChart messages within 1-2 business days.  For prescription refills, please ask your pharmacy to contact our office. Our fax number is [phone].  If you have an urgent issue when the clinic is closed that   cannot wait until the next business day, you can page your doctor at the number below.    Please note that while we do our best to be available for urgent issues outside of office hours, we are not available 24/7.   If you have an urgent issue and are unable to reach us, you may choose to seek medical care at your doctor's office, retail clinic, urgent care center, or emergency room.  If you have a medical emergency, please immediately call 911 or go to the emergency department.  Pager Numbers  - Dr. Kowalski: [phone]  - Dr. Moye: [phone]  - Dr. Stewart:  [phone]  In the event of inclement weather, please call our main line at [phone] for an update on the status of any delays or closures.  Dermatology Medication Tips: Please keep the boxes that topical medications come in in order to help keep track of the instructions about where and how to use these. Pharmacies typically print the medication instructions only on the boxes and not directly on the medication tubes.   If your medication is too expensive, please contact our office at [phone] option 4 or send us a message through MyChart.   We are unable to tell what your co-pay for medications will be in advance as this is different depending on your insurance coverage. However, we may be able to find a substitute medication at lower cost or fill out paperwork to get insurance to cover a needed medication.   If a prior authorization is required to get your medication covered by your insurance company, please allow us 1-2 business days to complete this process.  Drug prices often vary depending on where the prescription is filled and some pharmacies may offer cheaper prices.  The website www.goodrx.com contains coupons for medications through different pharmacies. The prices here do not account for what the cost may be with help from insurance (it may be cheaper with your insurance), but the website can give you the price if you did not use any insurance.  - You can print the associated coupon and take it with your prescription to the pharmacy.  - You may also stop by our office during regular business hours and pick up a GoodRx coupon card.  - If you need your prescription sent electronically to a different pharmacy, notify our office through [place] MyChart or by phone at [phone] option 4.     Si Usted Necesita Algo Despus de Su Visita  Tambin puede enviarnos un mensaje a travs de MyChart. Por lo general respondemos a los mensajes de MyChart en el transcurso de 1 a 2  das hbiles.  Para renovar recetas, por favor pida a su farmacia que se ponga en contacto con nuestra oficina. Nuestro nmero de fax es el [phone].  Si tiene un asunto urgente cuando la clnica est cerrada y que no puede esperar hasta el siguiente da hbil, puede llamar/localizar a su doctor(a) al nmero que aparece a continuacin.   Por favor, tenga en cuenta que aunque hacemos todo lo posible para estar disponibles para asuntos urgentes fuera del horario de oficina, no estamos disponibles las 24 horas del da, los 7 das de la semana.   Si tiene un problema urgente y no puede comunicarse con nosotros, puede optar por buscar atencin mdica  en el consultorio de su doctor(a), en una clnica privada, en un centro de atencin urgente o en una sala de emergencias.  Si tiene una emergencia mdica, por favor   llame inmediatamente al 911 o vaya a la sala de emergencias.  Nmeros de bper  - Dr. Kowalski: [phone]  - Dra. Moye: [phone]  - Dra. Stewart: [phone]  En caso de inclemencias del tiempo, por favor llame a nuestra lnea principal al [phone] para una actualizacin sobre el estado de cualquier retraso o cierre.  Consejos para la medicacin en dermatologa: Por favor, guarde las cajas en las que vienen los medicamentos de uso tpico para ayudarle a seguir las instrucciones sobre dnde y cmo usarlos. Las farmacias generalmente imprimen las instrucciones del medicamento slo en las cajas y no directamente en los tubos del medicamento.   Si su medicamento es muy caro, por favor, pngase en contacto con nuestra oficina llamando al [phone] y presione la opcin 4 o envenos un mensaje a travs de MyChart.   No podemos decirle cul ser su copago por los medicamentos por adelantado ya que esto es diferente dependiendo de la cobertura de su seguro. Sin embargo, es posible que podamos encontrar un medicamento sustituto a menor costo o llenar un formulario para que el  seguro cubra el medicamento que se considera necesario.   Si se requiere una autorizacin previa para que su compaa de seguros cubra su medicamento, por favor permtanos de 1 a 2 das hbiles para completar este proceso.  Los precios de los medicamentos varan con frecuencia dependiendo del lugar de dnde se surte la receta y alguna farmacias pueden ofrecer precios ms baratos.  El sitio web www.goodrx.com tiene cupones para medicamentos de diferentes farmacias. Los precios aqu no tienen en cuenta lo que podra costar con la ayuda del seguro (puede ser ms barato con su seguro), pero el sitio web puede darle el precio si no utiliz ningn seguro.  - Puede imprimir el cupn correspondiente y llevarlo con su receta a la farmacia.  - Tambin puede pasar por nuestra oficina durante el horario de atencin regular y recoger una tarjeta de cupones de GoodRx.  - Si necesita que su receta se enve electrnicamente a una farmacia diferente, informe a nuestra oficina a travs de MyChart de [place] o por telfono llamando al [phone] y presione la opcin 4.  

## 2023-03-19 ENCOUNTER — Encounter: Payer: Self-pay | Admitting: Dermatology

## 2023-03-25 ENCOUNTER — Telehealth: Payer: Self-pay

## 2023-03-25 NOTE — Telephone Encounter (Addendum)
Tried calling patient regarding results and scheduled follow up appointment. No answer. LMOM for patient to return call to office.    ----- Message from Ralene Bathe, MD sent at 03/24/2023  1:12 PM EDT ----- Diagnosis Skin , right scalp NODULAR HIDRADENOMA, BASE INVOLVED  Benign sweat gland growth. May recur - if recurs, recommend surgery. Make pt appt for 6 mos to recheck

## 2023-03-30 ENCOUNTER — Telehealth: Payer: Self-pay

## 2023-03-30 NOTE — Telephone Encounter (Signed)
Advised pt of bx result/sh ?

## 2023-03-30 NOTE — Telephone Encounter (Signed)
-----   Message from Deirdre Evener, MD sent at 03/24/2023  1:12 PM EDT ----- Diagnosis Skin , right scalp NODULAR HIDRADENOMA, BASE INVOLVED  Benign sweat gland growth. May recur - if recurs, recommend surgery. Make pt appt for 6 mos to recheck

## 2023-10-06 ENCOUNTER — Ambulatory Visit: Payer: BC Managed Care – PPO | Admitting: Dermatology

## 2024-08-29 ENCOUNTER — Emergency Department

## 2024-08-29 ENCOUNTER — Other Ambulatory Visit: Payer: Self-pay

## 2024-08-29 ENCOUNTER — Emergency Department
Admission: EM | Admit: 2024-08-29 | Discharge: 2024-08-29 | Disposition: A | Discharge status: Home / Self Care | Attending: Emergency Medicine | Admitting: Emergency Medicine

## 2024-08-29 DIAGNOSIS — K529 Noninfective gastroenteritis and colitis, unspecified: Secondary | ICD-10-CM | POA: Insufficient documentation

## 2024-08-29 DIAGNOSIS — R1013 Epigastric pain: Secondary | ICD-10-CM

## 2024-08-29 DIAGNOSIS — R112 Nausea with vomiting, unspecified: Secondary | ICD-10-CM

## 2024-08-29 DIAGNOSIS — D72829 Elevated white blood cell count, unspecified: Secondary | ICD-10-CM | POA: Diagnosis not present

## 2024-08-29 LAB — COMPREHENSIVE METABOLIC PANEL WITH GFR
ALT: 41 U/L (ref 0–44)
AST: 28 U/L (ref 15–41)
Albumin: 4.5 g/dL (ref 3.5–5.0)
Alkaline Phosphatase: 85 U/L (ref 38–126)
Anion gap: 14 (ref 5–15)
BUN: 17 mg/dL (ref 6–20)
CO2: 23 mmol/L (ref 22–32)
Calcium: 9.6 mg/dL (ref 8.9–10.3)
Chloride: 104 mmol/L (ref 98–111)
Creatinine, Ser: 1.02 mg/dL (ref 0.61–1.24)
GFR, Estimated: >60 mL/min (ref >60)
Glucose, Bld: 286 mg/dL — ABNORMAL HIGH (ref 70–99)
Potassium: 4.5 mmol/L (ref 3.5–5.1)
Sodium: 141 mmol/L (ref 135–145)
Total Bilirubin: 1.1 mg/dL (ref 0.0–1.2)
Total Protein: 8.1 g/dL (ref 6.5–8.1)

## 2024-08-29 LAB — CBC
HCT: 55.7 % — ABNORMAL HIGH (ref 39.0–52.0)
Hemoglobin: 19.3 g/dL — ABNORMAL HIGH (ref 13.0–17.0)
MCH: 30.5 pg (ref 26.0–34.0)
MCHC: 34.6 g/dL (ref 30.0–36.0)
MCV: 88 fL (ref 80.0–100.0)
Platelets: 42 K/uL — ABNORMAL LOW (ref 150–400)
RBC: 6.33 MIL/uL — ABNORMAL HIGH (ref 4.22–5.81)
RDW: 13 % (ref 11.5–15.5)
WBC: 17.1 K/uL — ABNORMAL HIGH (ref 4.0–10.5)
nRBC: 0 % (ref 0.0–0.2)

## 2024-08-29 LAB — URINALYSIS, ROUTINE W REFLEX MICROSCOPIC
Bacteria, UA: NONE SEEN
Bilirubin Urine: NEGATIVE
Glucose, UA: >=500 mg/dL — AB
Hgb urine dipstick: NEGATIVE
Ketones, ur: 5 mg/dL — AB
Leukocytes,Ua: NEGATIVE
Nitrite: NEGATIVE
Protein, ur: NEGATIVE mg/dL
Specific Gravity, Urine: >1.046 — ABNORMAL HIGH (ref 1.005–1.030)
pH: 6 (ref 5.0–8.0)

## 2024-08-29 LAB — LIPASE, BLOOD: Lipase: 54 U/L — ABNORMAL HIGH (ref 11–51)

## 2024-08-29 MED ORDER — ONDANSETRON 4 MG PO TBDP: 4.0000 mg | ORAL_TABLET | Freq: Three times a day (TID) | ORAL | 0 refills | Status: AC | PRN

## 2024-08-29 MED ORDER — ONDANSETRON HCL 4 MG/2ML IJ SOLN
4.0000 mg | Freq: Once | INTRAMUSCULAR | Status: AC
  Administered 2024-08-29: 4 mg via INTRAVENOUS
  Filled 2024-08-29: qty 2

## 2024-08-29 MED ORDER — CIPROFLOXACIN HCL 500 MG PO TABS: 500.0000 mg | ORAL_TABLET | Freq: Two times a day (BID) | ORAL | 0 refills | Status: AC

## 2024-08-29 MED ORDER — FENTANYL CITRATE PF 50 MCG/ML IJ SOSY
50.0000 ug | PREFILLED_SYRINGE | Freq: Once | INTRAMUSCULAR | Status: AC
  Administered 2024-08-29: 50 ug via INTRAVENOUS
  Filled 2024-08-29: qty 1

## 2024-08-29 MED ORDER — ONDANSETRON 4 MG PO TBDP: 4.0000 mg | ORAL_TABLET | Freq: Once | ORAL | Status: DC | PRN

## 2024-08-29 MED ORDER — IOHEXOL 300 MG/ML  SOLN
100.0000 mL | Freq: Once | INTRAMUSCULAR | Status: AC | PRN
  Administered 2024-08-29: 100 mL via INTRAVENOUS

## 2024-08-29 MED ORDER — CIPROFLOXACIN HCL 500 MG PO TABS
500.0000 mg | ORAL_TABLET | Freq: Once | ORAL | Status: AC
  Administered 2024-08-29: 500 mg via ORAL
  Filled 2024-08-29: qty 1

## 2024-08-29 MED ORDER — LACTATED RINGERS IV BOLUS
1000.0000 mL | Freq: Once | INTRAVENOUS | Status: AC
  Administered 2024-08-29: 1000 mL via INTRAVENOUS

## 2024-08-29 NOTE — Discharge Instructions (Addendum)
 Your evaluation in the emergency department was overall reassuring.  We did see evidence of inflammation of your GI tract, and we have started you on an antibiotic to help treat this.  Have also prescribed you a nausea medication to use as needed.  Continue to drink plenty of fluids to avoid dehydration.  Please do follow-up with your primary care provider for reevaluation, and return to the emergency department with any new or worsening symptoms.

## 2024-08-29 NOTE — ED Triage Notes (Signed)
 First Nurse Note: Patient to ED from [facility] for generalized abd pain with N/V/D. Ongoing since yesterday. KC states unable to get VS due to pain.

## 2024-08-29 NOTE — ED Provider Notes (Signed)
  Physical Exam  BP (!) 161/100 (BP Location: Left Arm)   Pulse 71   Temp 97.6 F (36.4 C) (Oral)   Resp 20   Ht 6' (1.829 m)   Wt 74.8 kg   SpO2 100%   BMI 22.38 kg/m   Physical Exam  Procedures  Procedures  ED Course / MDM   Clinical Course as of 08/29/24 1750  Tue Aug 29, 2024  1531 S/o from Dr. Jossie: 42M hx pancreatitis p/w upper ab pain - labs w/ leukocytosis, thrombocytosis  TO DO: - f/u CT [MM]  1650 CTAP: IMPRESSION: 1. Moderately inflamed segment of proximal jejunum along the midline of the anterior mid abdomen, consistent with an infectious or inflammatory enteritis. 2. Colonic diverticulosis. 3. Hepatic steatosis. 4. Fat-containing umbilical hernia. 5. Aortic atherosclerosis.   [MM]  1650 Urinalysis reviewed, no evidence of infection [MM]  1735 Patient reevaluated, notes he is feeling much better.  Does have some mild ongoing nausea, requests further out of nausea medication.  Does feel somewhat warm, I personally checked his temperature, remains afebrile.  Tells me he has had 2 days of nausea/vomiting/diarrhea.  No recent antibiotics or travel.  Shared decision making, will start patient on short course of ciprofloxacin , will give first dose here in emergency department for presumed bacterial enteritis.  Plan to discharge home with Zofran  as well.  Counseled on oral hydration.  Plan for PMD follow-up.  ED return precautions in place.  Patient agrees with plan. [MM]    Clinical Course User Index [MM] Clarine Ozell LABOR, MD   Medical Decision Making Amount and/or Complexity of Data Reviewed Labs: ordered. Radiology: ordered.  Risk Prescription drug management.          Clarine Ozell LABOR, MD 08/29/24 1750

## 2024-08-29 NOTE — ED Triage Notes (Signed)
 Pt arrived via POV from [facility] with c/o severe abdominal pain that started yesterday. Pt stated that this has happened before and it was acute pancreatitis. Pt is moaning and vomiting in triage. Pt is A&Ox4.

## 2024-08-29 NOTE — ED Provider Notes (Signed)
 [facility] Provider Note   Event Date/Time   First MD Initiated Contact with Patient 08/29/24 1416     (approximate) History  Abdominal Pain  HPI Scott Daniel is a 58 y.o. male with a past medical history of bipolar disorder, polysubstance abuse, and pancreatitis who presents complaining of severe epigastric and periumbilical abdominal pain that started yesterday and has been worsening since onset.  Patient states that he was seen at [facility] clinic and sent here immediately with nausea/vomiting/diarrhea.  Patient states that this feels similar to previous episodes of pancreatitis that he has had.  Patient states that last episode of pancreatitis they believed was secondary to Depakote and patient has not had any new medications at this time ROS: Patient currently denies any vision changes, tinnitus, difficulty speaking, facial droop, sore throat, chest pain, shortness of breath, dysuria, or weakness/numbness/paresthesias in any extremity   Physical Exam  Triage Vital Signs: ED Triage Vitals  Encounter Vitals Group     BP 08/29/24 1400 (!) 161/100     Girls Systolic BP Percentile --      Girls Diastolic BP Percentile --      Boys Systolic BP Percentile --      Boys Diastolic BP Percentile --      Pulse Rate 08/29/24 1400 71     Resp 08/29/24 1400 20     Temp --      Temp src --      SpO2 08/29/24 1400 100 %     Weight 08/29/24 1403 165 lb (74.8 kg)     Height 08/29/24 1403 6' (1.829 m)     Head Circumference --      Peak Flow --      Pain Score 08/29/24 1401 10     Pain Loc --      Pain Education --      Exclude from Growth Chart --    Most recent vital signs: Vitals:   08/29/24 1400  BP: (!) 161/100  Pulse: 71  Resp: 20  SpO2: 100%   General: Awake, oriented x4. CV:  Good peripheral perfusion. Resp:  Normal effort. Abd:  No distention.  Epigastric tenderness to palpation Other:  Middle-aged well-developed, well-nourished Caucasian male  laying on stretcher in a right lateral decubitus position and in moderate distress secondary to pain ED Results / Procedures / Treatments  Labs (all labs ordered are listed, but only abnormal results are displayed) Labs Reviewed  LIPASE, BLOOD  COMPREHENSIVE METABOLIC PANEL WITH GFR  CBC  URINALYSIS, ROUTINE W REFLEX MICROSCOPIC   RADIOLOGY ED MD interpretation: Pending - All radiology independently interpreted and agree with radiology assessment Official radiology report(s): No results found. PROCEDURES: Critical Care performed: No Procedures MEDICATIONS ORDERED IN ED: Medications  ondansetron  (ZOFRAN -ODT) disintegrating tablet 4 mg (has no administration in time range)  fentaNYL  (SUBLIMAZE ) injection 50 mcg (50 mcg Intravenous Given 08/29/24 1439)  lactated ringers  bolus 1,000 mL (1,000 mLs Intravenous New Bag/Given 08/29/24 1453)   IMPRESSION / MDM / ASSESSMENT AND PLAN / ED COURSE  I reviewed the triage vital signs and the nursing notes.                             The patient is on the cardiac monitor to evaluate for evidence of arrhythmia and/or significant heart rate changes. Patient's presentation is most consistent with acute presentation with potential threat to life or bodily function. Patient is a  58 year old male with the above-stated past medical history presents for midepigastric and periumbilical abdominal pain that has been present over the last 24 hours with associated nausea/vomiting in the setting of previous pancreatitis. DDx: Pancreatitis, biliary disease, gastritis, small bowel obstruction, appendicitis, gastroenteritis Plan: CBC, CMP, lipase, UA, CT abdomen/pelvis  Care of this patient will be signed out to the oncoming physician at the end of my shift.  All pertinent patient information conveyed and all questions answered.  All further care and disposition decisions will be made by the oncoming physician.   FINAL CLINICAL IMPRESSION(S) / ED DIAGNOSES    Final diagnoses:  Nausea and vomiting, unspecified vomiting type  Epigastric pain   Rx / DC Orders   ED Discharge Orders     None      Note:  This document was prepared using Dragon voice recognition software and may include unintentional dictation errors.   Roderick Sweezy K, MD 08/29/24 1501
